# Patient Record
Sex: Female | Born: 1980 | Race: Black or African American | Hispanic: No | Marital: Married | Smoking: Never smoker
Health system: Southern US, Community
[De-identification: ages and names within clinical notes are randomized; demographics above are authoritative.]

## PROBLEM LIST (undated history)

## (undated) ENCOUNTER — Inpatient Hospital Stay (HOSPITAL_COMMUNITY): Payer: Self-pay

## (undated) DIAGNOSIS — O009 Unspecified ectopic pregnancy without intrauterine pregnancy: Secondary | ICD-10-CM

## (undated) HISTORY — PX: NO PAST SURGERIES: SHX2092

---

## 2003-11-21 ENCOUNTER — Other Ambulatory Visit: Admission: RE | Admit: 2003-11-21 | Discharge: 2003-11-21 | Payer: Self-pay | Admitting: Obstetrics and Gynecology

## 2004-07-07 ENCOUNTER — Emergency Department (HOSPITAL_COMMUNITY): Admission: EM | Admit: 2004-07-07 | Discharge: 2004-07-07 | Payer: Self-pay | Admitting: Emergency Medicine

## 2005-02-20 ENCOUNTER — Inpatient Hospital Stay (HOSPITAL_COMMUNITY): Admission: AD | Admit: 2005-02-20 | Discharge: 2005-02-20 | Payer: Self-pay | Admitting: Obstetrics and Gynecology

## 2005-09-05 ENCOUNTER — Ambulatory Visit (HOSPITAL_COMMUNITY): Admission: RE | Admit: 2005-09-05 | Discharge: 2005-09-05 | Payer: Self-pay | Admitting: Specialist

## 2005-12-09 ENCOUNTER — Other Ambulatory Visit: Admission: RE | Admit: 2005-12-09 | Discharge: 2005-12-09 | Payer: Self-pay | Admitting: Obstetrics and Gynecology

## 2005-12-19 ENCOUNTER — Emergency Department (HOSPITAL_COMMUNITY): Admission: EM | Admit: 2005-12-19 | Discharge: 2005-12-19 | Payer: Self-pay | Admitting: Emergency Medicine

## 2007-03-01 ENCOUNTER — Other Ambulatory Visit: Admission: RE | Admit: 2007-03-01 | Discharge: 2007-03-01 | Payer: Self-pay | Admitting: Obstetrics and Gynecology

## 2008-03-06 ENCOUNTER — Other Ambulatory Visit: Admission: RE | Admit: 2008-03-06 | Discharge: 2008-03-06 | Payer: Self-pay | Admitting: Obstetrics and Gynecology

## 2009-02-15 ENCOUNTER — Ambulatory Visit (HOSPITAL_COMMUNITY): Admission: RE | Admit: 2009-02-15 | Discharge: 2009-02-15 | Payer: Self-pay | Admitting: Obstetrics and Gynecology

## 2009-03-02 ENCOUNTER — Inpatient Hospital Stay (HOSPITAL_COMMUNITY): Admission: AD | Admit: 2009-03-02 | Discharge: 2009-03-02 | Payer: Self-pay | Admitting: Obstetrics and Gynecology

## 2009-12-28 ENCOUNTER — Other Ambulatory Visit: Admission: RE | Admit: 2009-12-28 | Discharge: 2009-12-28 | Payer: Self-pay | Admitting: Obstetrics and Gynecology

## 2010-08-08 LAB — CBC
HCT: 37 % (ref 36.0–46.0)
Hemoglobin: 12.4 g/dL (ref 12.0–15.0)
MCHC: 33.4 g/dL (ref 30.0–36.0)
MCV: 94.8 fL (ref 78.0–100.0)
Platelets: 239 10*3/uL (ref 150–400)
RBC: 3.91 MIL/uL (ref 3.87–5.11)
RDW: 13.1 % (ref 11.5–15.5)
WBC: 9.1 10*3/uL (ref 4.0–10.5)

## 2010-08-08 LAB — CREATININE, SERUM
Creatinine, Ser: 0.65 mg/dL (ref 0.4–1.2)
GFR calc Af Amer: >60 mL/min (ref >60)
GFR calc non Af Amer: >60 mL/min (ref >60)

## 2010-08-08 LAB — DIFFERENTIAL
Basophils Absolute: 0 10*3/uL (ref 0.0–0.1)
Basophils Relative: 0 % (ref 0–1)
Eosinophils Absolute: 0.1 10*3/uL (ref 0.0–0.7)
Eosinophils Relative: 1 % (ref 0–5)
Lymphocytes Relative: 35 % (ref 12–46)
Lymphs Abs: 3.2 10*3/uL (ref 0.7–4.0)
Monocytes Absolute: 0.6 10*3/uL (ref 0.1–1.0)
Monocytes Relative: 7 % (ref 3–12)
Neutro Abs: 5.2 10*3/uL (ref 1.7–7.7)
Neutrophils Relative %: 57 % (ref 43–77)

## 2010-08-08 LAB — ABO/RH: ABO/RH(D): O POS

## 2010-08-08 LAB — BUN: BUN: 5 mg/dL — ABNORMAL LOW (ref 6–23)

## 2010-08-08 LAB — AST: AST: 20 U/L (ref 0–37)

## 2011-01-27 ENCOUNTER — Other Ambulatory Visit (HOSPITAL_COMMUNITY)
Admission: RE | Admit: 2011-01-27 | Discharge: 2011-01-27 | Disposition: A | Discharge status: Home / Self Care | Payer: Self-pay | Source: Ambulatory Visit | Attending: Obstetrics and Gynecology | Admitting: Obstetrics and Gynecology

## 2011-01-27 ENCOUNTER — Other Ambulatory Visit: Payer: Self-pay | Admitting: Obstetrics and Gynecology

## 2011-01-27 DIAGNOSIS — Z01419 Encounter for gynecological examination (general) (routine) without abnormal findings: Secondary | ICD-10-CM | POA: Insufficient documentation

## 2012-03-01 ENCOUNTER — Other Ambulatory Visit (HOSPITAL_COMMUNITY)
Admission: RE | Admit: 2012-03-01 | Discharge: 2012-03-01 | Disposition: A | Discharge status: Home / Self Care | Payer: Managed Care, Other (non HMO) | Source: Ambulatory Visit | Attending: Obstetrics and Gynecology | Admitting: Obstetrics and Gynecology

## 2012-03-01 ENCOUNTER — Other Ambulatory Visit: Payer: Self-pay | Admitting: Obstetrics and Gynecology

## 2012-03-01 DIAGNOSIS — Z01419 Encounter for gynecological examination (general) (routine) without abnormal findings: Secondary | ICD-10-CM | POA: Insufficient documentation

## 2013-02-28 ENCOUNTER — Other Ambulatory Visit (HOSPITAL_COMMUNITY): Payer: Self-pay | Admitting: Obstetrics and Gynecology

## 2013-02-28 DIAGNOSIS — IMO0002 Reserved for concepts with insufficient information to code with codable children: Secondary | ICD-10-CM

## 2013-03-02 ENCOUNTER — Ambulatory Visit (HOSPITAL_COMMUNITY)
Admission: RE | Admit: 2013-03-02 | Discharge: 2013-03-02 | Disposition: A | Discharge status: Home / Self Care | Payer: Managed Care, Other (non HMO) | Source: Ambulatory Visit | Attending: Obstetrics and Gynecology | Admitting: Obstetrics and Gynecology

## 2013-03-02 DIAGNOSIS — N979 Female infertility, unspecified: Secondary | ICD-10-CM | POA: Insufficient documentation

## 2013-03-02 DIAGNOSIS — IMO0002 Reserved for concepts with insufficient information to code with codable children: Secondary | ICD-10-CM

## 2013-03-02 MED ORDER — IOHEXOL 300 MG/ML  SOLN
20.0000 mL | Freq: Once | INTRAMUSCULAR | Status: AC | PRN
  Administered 2013-03-02: 20 mL

## 2014-04-18 ENCOUNTER — Other Ambulatory Visit: Payer: Self-pay | Admitting: Obstetrics and Gynecology

## 2014-04-18 ENCOUNTER — Other Ambulatory Visit (HOSPITAL_COMMUNITY)
Admission: RE | Admit: 2014-04-18 | Discharge: 2014-04-18 | Disposition: A | Discharge status: Home / Self Care | Payer: Managed Care, Other (non HMO) | Source: Ambulatory Visit | Attending: Obstetrics and Gynecology | Admitting: Obstetrics and Gynecology

## 2014-04-18 DIAGNOSIS — Z1151 Encounter for screening for human papillomavirus (HPV): Secondary | ICD-10-CM | POA: Diagnosis present

## 2014-04-18 DIAGNOSIS — Z01419 Encounter for gynecological examination (general) (routine) without abnormal findings: Secondary | ICD-10-CM | POA: Insufficient documentation

## 2014-04-18 DIAGNOSIS — Z113 Encounter for screening for infections with a predominantly sexual mode of transmission: Secondary | ICD-10-CM | POA: Diagnosis present

## 2014-04-20 LAB — CYTOLOGY - PAP

## 2014-05-28 ENCOUNTER — Encounter (HOSPITAL_COMMUNITY): Payer: Self-pay | Admitting: *Deleted

## 2014-05-28 ENCOUNTER — Inpatient Hospital Stay (HOSPITAL_COMMUNITY)
Admission: AD | Admit: 2014-05-28 | Discharge: 2014-05-28 | Disposition: A | Discharge status: Home / Self Care | Payer: Managed Care, Other (non HMO) | Source: Ambulatory Visit | Attending: Obstetrics and Gynecology | Admitting: Obstetrics and Gynecology

## 2014-05-28 DIAGNOSIS — R109 Unspecified abdominal pain: Secondary | ICD-10-CM

## 2014-05-28 DIAGNOSIS — A084 Viral intestinal infection, unspecified: Secondary | ICD-10-CM | POA: Diagnosis not present

## 2014-05-28 DIAGNOSIS — O30002 Twin pregnancy, unspecified number of placenta and unspecified number of amniotic sacs, second trimester: Secondary | ICD-10-CM

## 2014-05-28 DIAGNOSIS — N949 Unspecified condition associated with female genital organs and menstrual cycle: Secondary | ICD-10-CM

## 2014-05-28 DIAGNOSIS — Z3A17 17 weeks gestation of pregnancy: Secondary | ICD-10-CM

## 2014-05-28 DIAGNOSIS — O30009 Twin pregnancy, unspecified number of placenta and unspecified number of amniotic sacs, unspecified trimester: Secondary | ICD-10-CM

## 2014-05-28 DIAGNOSIS — R1032 Left lower quadrant pain: Secondary | ICD-10-CM | POA: Diagnosis present

## 2014-05-28 DIAGNOSIS — O09812 Supervision of pregnancy resulting from assisted reproductive technology, second trimester: Secondary | ICD-10-CM | POA: Insufficient documentation

## 2014-05-28 DIAGNOSIS — O09522 Supervision of elderly multigravida, second trimester: Secondary | ICD-10-CM | POA: Insufficient documentation

## 2014-05-28 DIAGNOSIS — Z3A16 16 weeks gestation of pregnancy: Secondary | ICD-10-CM | POA: Diagnosis not present

## 2014-05-28 DIAGNOSIS — O9989 Other specified diseases and conditions complicating pregnancy, childbirth and the puerperium: Secondary | ICD-10-CM | POA: Insufficient documentation

## 2014-05-28 HISTORY — DX: Unspecified ectopic pregnancy without intrauterine pregnancy: O00.90

## 2014-05-28 LAB — URINALYSIS, ROUTINE W REFLEX MICROSCOPIC
Bilirubin Urine: NEGATIVE
Glucose, UA: NEGATIVE mg/dL
Hgb urine dipstick: NEGATIVE
Ketones, ur: 15 mg/dL — AB
Leukocytes, UA: NEGATIVE
Nitrite: NEGATIVE
Protein, ur: NEGATIVE mg/dL
Specific Gravity, Urine: 1.025 (ref 1.005–1.030)
Urobilinogen, UA: 0.2 mg/dL (ref 0.0–1.0)
pH: 5.5 (ref 5.0–8.0)

## 2014-05-28 MED ORDER — PROMETHAZINE HCL 12.5 MG PO TABS: 12.5000 mg | ORAL_TABLET | Freq: Four times a day (QID) | ORAL | Status: DC | PRN

## 2014-05-28 MED ORDER — ACETAMINOPHEN 500 MG PO TABS
1000.0000 mg | ORAL_TABLET | Freq: Once | ORAL | Status: AC
  Administered 2014-05-28: 1000 mg via ORAL
  Filled 2014-05-28: qty 2

## 2014-05-28 MED ORDER — PROMETHAZINE HCL 25 MG/ML IJ SOLN
25.0000 mg | Freq: Once | INTRAVENOUS | Status: AC
  Administered 2014-05-28: 25 mg via INTRAVENOUS
  Filled 2014-05-28: qty 1

## 2014-05-28 MED ORDER — ONDANSETRON HCL 4 MG PO TABS: 4.0000 mg | ORAL_TABLET | Freq: Four times a day (QID) | ORAL | Status: DC

## 2014-05-28 NOTE — MAU Provider Note (Signed)
History     CSN: [id number]  Arrival date and time: 05/28/14 1351   First Provider Initiated Contact with Patient 05/28/14 1429      Chief Complaint  Patient presents with  . Abdominal Pain  . Emesis During Pregnancy  . Diarrhea   HPI  Ms. Courtney Farmer is a 34 y.o. G3P0020 at [redacted]w[redacted]d with twins as a results of IVF who presents to MAU today with complaint of abdominal pain in the LLQ since yesterday. She states N/V/D started today. She denies vaginal bleeding, discharge or LOF. She denies UTI symptoms or fever. 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   OB History    Gravida Para Term Preterm AB TAB SAB Ectopic Multiple Living   3    2  1 1         Past Medical History  Diagnosis Date  . Ectopic pregnancy     Past Surgical History  Procedure Laterality Date  . No past surgeries      History reviewed. No pertinent family history.  History  Substance Use Topics  . Smoking status: Never Smoker   . Smokeless tobacco: Not on file  . Alcohol Use: No    Allergies: No Known Allergies  Prescriptions prior to admission  Medication Sig Dispense Refill Last Dose  . Prenatal Vit-Fe Fumarate-FA (MULTIVITAMIN-PRENATAL) 27-0.8 MG TABS tablet Take 1 tablet by mouth daily at 12 noon.   05/27/2014 at Unknown time  . pyridoxine (B-6) 100 MG tablet Take 100 mg by mouth daily as needed (nausea).   05/27/2014 at Unknown time    Review of Systems  Constitutional: Negative for fever and malaise/fatigue.  Gastrointestinal: Positive for nausea, vomiting, abdominal pain and diarrhea. Negative for constipation.  Genitourinary: Negative for dysuria, urgency and frequency.       Neg - vaginal bleeding, discharge, LOF  Neurological: Negative for weakness.   Physical Exam   Blood pressure 117/62, pulse 101, temperature 97.8 F (36.6 C), temperature source Oral, resp. rate 18.  Physical Exam  Constitutional: She is oriented to  person, place, and time. She appears well-developed and well-nourished. No distress.  HENT:  Head: Normocephalic.  Cardiovascular: Normal rate.   Respiratory: Effort normal.  GI: Soft. Bowel sounds are normal. She exhibits no distension and no mass. There is tenderness (moderate tenderness to palpation of the LLQ). There is no rebound and no guarding.  Neurological: She is alert and oriented to person, place, and time.  Skin: Skin is warm and dry. No erythema.  Psychiatric: She has a normal mood and affect.   Results for orders placed or performed during the hospital encounter of 05/28/14 (from the past 24 hour(s))  Urinalysis, Routine w reflex microscopic     Status: Abnormal   Collection Time: 05/28/14  2:02 PM  Result Value Ref Range   Color, Urine YELLOW YELLOW   APPearance CLEAR CLEAR   Specific Gravity, Urine 1.025 1.005 - 1.030   pH 5.5 5.0 - 8.0   Glucose, UA NEGATIVE NEGATIVE mg/dL   Hgb urine dipstick NEGATIVE NEGATIVE   Bilirubin Urine NEGATIVE NEGATIVE   Ketones, ur 15 (A) NEGATIVE mg/dL   Protein, ur NEGATIVE NEGATIVE mg/dL   Urobilinogen, UA 0.2 0.0 - 1.0 mg/dL   Nitrite NEGATIVE NEGATIVE   Leukocytes, UA NEGATIVE NEGATIVE     MAU Course  Procedures None  MDM FHR 136 bpm and 144 bpm with doppler UA today shows mild dehydration Discussed  with Dr. Alesia Richards at 1440 - agrees that IV fluids with anti-emetics is appropriate management. Patient may also have Tylenol for pain if desired. She also recommends follow-up with [facility] this week.  Patient given IV D5LR with 25 mg Phenergan infusion. Patient reports resolution of nausea and no episodes of vomiting or diarrhea while in MAU today. Patient continues to complain of LLQ pain. 1000 mg Tylenol given.  Patient reports some improvement in LLQ pain Assessment and Plan  A: Twin pregnancy at [redacted]w[redacted]d Viral gastroenteritis Round ligament pain  P: Discharge home Rx for Phenergan and Zofran given to patient Patient advised  to increase PO hydration as tolerated Patient advised to call the office for a follow-up appointment this week Patient may return to MAU as needed or if her condition were to change or worsen   Luvenia Redden, PA-C  05/28/2014, 4:50 PM

## 2014-05-28 NOTE — MAU Note (Signed)
Pt states she began having lower abd cramping yesterday, continues today, also began having vomiting & diarrhea today.

## 2014-05-28 NOTE — MAU Note (Signed)
IVF pregnancy

## 2014-05-28 NOTE — Discharge Instructions (Signed)
Viral Gastroenteritis °Viral gastroenteritis is also known as stomach flu. This condition affects the stomach and intestinal tract. It can cause sudden diarrhea and vomiting. The illness typically lasts 3 to 8 days. Most people develop an immune response that eventually gets rid of the virus. While this natural response develops, the virus can make you quite ill. °CAUSES  °Many different viruses can cause gastroenteritis, such as rotavirus or noroviruses. You can catch one of these viruses by consuming contaminated food or water. You may also catch a virus by sharing utensils or other personal items with an infected person or by touching a contaminated surface. °SYMPTOMS  °The most common symptoms are diarrhea and vomiting. These problems can cause a severe loss of body fluids (dehydration) and a body salt (electrolyte) imbalance. Other symptoms may include: °· Fever. °· Headache. °· Fatigue. °· Abdominal pain. °DIAGNOSIS  °Your caregiver can usually diagnose viral gastroenteritis based on your symptoms and a physical exam. A stool sample may also be taken to test for the presence of viruses or other infections. °TREATMENT  °This illness typically goes away on its own. Treatments are aimed at rehydration. The most serious cases of viral gastroenteritis involve vomiting so severely that you are not able to keep fluids down. In these cases, fluids must be given through an intravenous line (IV). °HOME CARE INSTRUCTIONS  °· Drink enough fluids to keep your urine clear or pale yellow. Drink small amounts of fluids frequently and increase the amounts as tolerated. °· Ask your caregiver for specific rehydration instructions. °· Avoid: °¨ Foods high in sugar. °¨ Alcohol. °¨ Carbonated drinks. °¨ Tobacco. °¨ Juice. °¨ Caffeine drinks. °¨ Extremely hot or cold fluids. °¨ Fatty, greasy foods. °¨ Too much intake of anything at one time. °¨ Dairy products until 24 to 48 hours after diarrhea stops. °· You may consume probiotics.  Probiotics are active cultures of beneficial bacteria. They may lessen the amount and number of diarrheal stools in adults. Probiotics can be found in yogurt with active cultures and in supplements. °· Wash your hands well to avoid spreading the virus. °· Only take over-the-counter or prescription medicines for pain, discomfort, or fever as directed by your caregiver. Do not give aspirin to children. Antidiarrheal medicines are not recommended. °· Ask your caregiver if you should continue to take your regular prescribed and over-the-counter medicines. °· Keep all follow-up appointments as directed by your caregiver. °SEEK IMMEDIATE MEDICAL CARE IF:  °· You are unable to keep fluids down. °· You do not urinate at least once every 6 to 8 hours. °· You develop shortness of breath. °· You notice blood in your stool or vomit. This may look like coffee grounds. °· You have abdominal pain that increases or is concentrated in one small area (localized). °· You have persistent vomiting or diarrhea. °· You have a fever. °· The patient is a child younger than 3 months, and he or she has a fever. °· The patient is a child older than 3 months, and he or she has a fever and persistent symptoms. °· The patient is a child older than 3 months, and he or she has a fever and symptoms suddenly get worse. °· The patient is a baby, and he or she has no tears when crying. °MAKE SURE YOU:  °· Understand these instructions. °· Will watch your condition. °· Will get help right away if you are not doing well or get worse. °Document Released: 04/21/2005 Document Revised: 07/14/2011 Document Reviewed: 02/05/2011 °  [employer] Patient Information 2015 [place]. This information is not intended to replace advice given to you by your health care provider. Make sure you discuss any questions you have with your health care provider. Abdominal Pain During Pregnancy Belly (abdominal) pain is common during pregnancy. Most of the time, it is not a  serious problem. Other times, it can be a sign that something is wrong with the pregnancy. Always tell your doctor if you have belly pain. HOME CARE Monitor your belly pain for any changes. The following actions may help you feel better:  Do not have sex (intercourse) or put anything in your vagina until you feel better.  Rest until your pain stops.  Drink clear fluids if you feel sick to your stomach (nauseous). Do not eat solid food until you feel better.  Only take medicine as told by your doctor.  Keep all doctor visits as told. GET HELP RIGHT AWAY IF:   You are bleeding, leaking fluid, or pieces of tissue come out of your vagina.  You have more pain or cramping.  You keep throwing up (vomiting).  You have pain when you pee (urinate) or have blood in your pee.  You have a fever.  You do not feel your baby moving as much.  You feel very weak or feel like passing out.  You have trouble breathing, with or without belly pain.  You have a very bad headache and belly pain.  You have fluid leaking from your vagina and belly pain.  You keep having watery poop (diarrhea).  Your belly pain does not go away after resting, or the pain gets worse. MAKE SURE YOU:   Understand these instructions.  Will watch your condition.  Will get help right away if you are not doing well or get worse. Document Released: 04/09/2009 Document Revised: 12/22/2012 Document Reviewed: 11/18/2012 [facility] Patient Information 2015 [place], [place]. This information is not intended to replace advice given to you by your health care provider. Make sure you discuss any questions you have with your health care provider.

## 2014-06-28 LAB — US OB DETAIL + 14 WK

## 2014-06-29 ENCOUNTER — Other Ambulatory Visit (HOSPITAL_COMMUNITY): Payer: Self-pay | Admitting: Obstetrics and Gynecology

## 2014-06-29 DIAGNOSIS — O4102X2 Oligohydramnios, second trimester, fetus 2: Secondary | ICD-10-CM

## 2014-06-29 DIAGNOSIS — IMO0001 Reserved for inherently not codable concepts without codable children: Secondary | ICD-10-CM

## 2014-06-29 DIAGNOSIS — Z3689 Encounter for other specified antenatal screening: Secondary | ICD-10-CM

## 2014-07-07 ENCOUNTER — Encounter (HOSPITAL_COMMUNITY): Payer: Self-pay

## 2014-07-07 ENCOUNTER — Other Ambulatory Visit (HOSPITAL_COMMUNITY): Payer: Self-pay | Admitting: Obstetrics and Gynecology

## 2014-07-07 ENCOUNTER — Ambulatory Visit (HOSPITAL_COMMUNITY)
Admission: RE | Admit: 2014-07-07 | Discharge: 2014-07-07 | Disposition: A | Discharge status: Home / Self Care | Payer: Managed Care, Other (non HMO) | Source: Ambulatory Visit | Attending: Obstetrics and Gynecology | Admitting: Obstetrics and Gynecology

## 2014-07-07 DIAGNOSIS — Z36 Encounter for antenatal screening of mother: Secondary | ICD-10-CM | POA: Insufficient documentation

## 2014-07-07 DIAGNOSIS — IMO0001 Reserved for inherently not codable concepts without codable children: Secondary | ICD-10-CM

## 2014-07-07 DIAGNOSIS — O09522 Supervision of elderly multigravida, second trimester: Secondary | ICD-10-CM | POA: Insufficient documentation

## 2014-07-07 DIAGNOSIS — Z3A22 22 weeks gestation of pregnancy: Secondary | ICD-10-CM | POA: Diagnosis not present

## 2014-07-07 DIAGNOSIS — O30042 Twin pregnancy, dichorionic/diamniotic, second trimester: Secondary | ICD-10-CM | POA: Diagnosis not present

## 2014-07-07 DIAGNOSIS — O09812 Supervision of pregnancy resulting from assisted reproductive technology, second trimester: Secondary | ICD-10-CM | POA: Diagnosis not present

## 2014-07-07 DIAGNOSIS — O4102X2 Oligohydramnios, second trimester, fetus 2: Secondary | ICD-10-CM

## 2014-07-07 DIAGNOSIS — Z3689 Encounter for other specified antenatal screening: Secondary | ICD-10-CM | POA: Insufficient documentation

## 2014-07-10 ENCOUNTER — Encounter (HOSPITAL_COMMUNITY): Payer: Self-pay | Admitting: Obstetrics and Gynecology

## 2014-07-11 ENCOUNTER — Other Ambulatory Visit (HOSPITAL_COMMUNITY): Payer: Self-pay | Admitting: Obstetrics and Gynecology

## 2014-07-31 ENCOUNTER — Other Ambulatory Visit (HOSPITAL_COMMUNITY): Payer: Self-pay | Admitting: Maternal and Fetal Medicine

## 2014-07-31 DIAGNOSIS — O09812 Supervision of pregnancy resulting from assisted reproductive technology, second trimester: Secondary | ICD-10-CM

## 2014-07-31 DIAGNOSIS — O09522 Supervision of elderly multigravida, second trimester: Secondary | ICD-10-CM

## 2014-07-31 DIAGNOSIS — O30042 Twin pregnancy, dichorionic/diamniotic, second trimester: Secondary | ICD-10-CM

## 2014-07-31 DIAGNOSIS — Z3A27 27 weeks gestation of pregnancy: Secondary | ICD-10-CM

## 2014-08-09 ENCOUNTER — Encounter (HOSPITAL_COMMUNITY): Payer: Self-pay

## 2014-08-09 ENCOUNTER — Encounter (HOSPITAL_COMMUNITY): Payer: Self-pay | Admitting: Anesthesiology

## 2014-08-09 ENCOUNTER — Inpatient Hospital Stay (HOSPITAL_COMMUNITY)
Admission: AD | Admit: 2014-08-09 | Discharge: 2014-09-06 | DRG: 765 | Disposition: A | Discharge status: Home / Self Care | Payer: Managed Care, Other (non HMO) | Source: Ambulatory Visit | Attending: Obstetrics and Gynecology | Admitting: Obstetrics and Gynecology

## 2014-08-09 ENCOUNTER — Inpatient Hospital Stay (HOSPITAL_COMMUNITY): Payer: Managed Care, Other (non HMO)

## 2014-08-09 ENCOUNTER — Other Ambulatory Visit (HOSPITAL_COMMUNITY): Payer: Self-pay | Admitting: Maternal and Fetal Medicine

## 2014-08-09 ENCOUNTER — Ambulatory Visit (HOSPITAL_COMMUNITY)
Admission: RE | Admit: 2014-08-09 | Discharge: 2014-08-09 | Disposition: A | Discharge status: Home / Self Care | Payer: Managed Care, Other (non HMO) | Source: Ambulatory Visit | Attending: Obstetrics and Gynecology | Admitting: Obstetrics and Gynecology

## 2014-08-09 DIAGNOSIS — D252 Subserosal leiomyoma of uterus: Secondary | ICD-10-CM | POA: Diagnosis present

## 2014-08-09 DIAGNOSIS — O4102X1 Oligohydramnios, second trimester, fetus 1: Secondary | ICD-10-CM | POA: Diagnosis present

## 2014-08-09 DIAGNOSIS — Z3A27 27 weeks gestation of pregnancy: Secondary | ICD-10-CM

## 2014-08-09 DIAGNOSIS — O42912 Preterm premature rupture of membranes, unspecified as to length of time between rupture and onset of labor, second trimester: Secondary | ICD-10-CM | POA: Diagnosis present

## 2014-08-09 DIAGNOSIS — Z3A30 30 weeks gestation of pregnancy: Secondary | ICD-10-CM | POA: Diagnosis not present

## 2014-08-09 DIAGNOSIS — Z3A22 22 weeks gestation of pregnancy: Secondary | ICD-10-CM

## 2014-08-09 DIAGNOSIS — O4103X2 Oligohydramnios, third trimester, fetus 2: Secondary | ICD-10-CM

## 2014-08-09 DIAGNOSIS — O09522 Supervision of elderly multigravida, second trimester: Secondary | ICD-10-CM

## 2014-08-09 DIAGNOSIS — O30042 Twin pregnancy, dichorionic/diamniotic, second trimester: Secondary | ICD-10-CM | POA: Diagnosis present

## 2014-08-09 DIAGNOSIS — O09812 Supervision of pregnancy resulting from assisted reproductive technology, second trimester: Secondary | ICD-10-CM

## 2014-08-09 DIAGNOSIS — O4103X1 Oligohydramnios, third trimester, fetus 1: Secondary | ICD-10-CM | POA: Diagnosis not present

## 2014-08-09 DIAGNOSIS — O4100X0 Oligohydramnios, unspecified trimester, not applicable or unspecified: Secondary | ICD-10-CM | POA: Insufficient documentation

## 2014-08-09 DIAGNOSIS — Z23 Encounter for immunization: Secondary | ICD-10-CM | POA: Diagnosis not present

## 2014-08-09 DIAGNOSIS — O42919 Preterm premature rupture of membranes, unspecified as to length of time between rupture and onset of labor, unspecified trimester: Secondary | ICD-10-CM | POA: Diagnosis not present

## 2014-08-09 DIAGNOSIS — Z3689 Encounter for other specified antenatal screening: Secondary | ICD-10-CM

## 2014-08-09 DIAGNOSIS — O4103X0 Oligohydramnios, third trimester, not applicable or unspecified: Secondary | ICD-10-CM | POA: Diagnosis present

## 2014-08-09 DIAGNOSIS — O30043 Twin pregnancy, dichorionic/diamniotic, third trimester: Secondary | ICD-10-CM | POA: Diagnosis not present

## 2014-08-09 DIAGNOSIS — O4290 Premature rupture of membranes, unspecified as to length of time between rupture and onset of labor, unspecified weeks of gestation: Secondary | ICD-10-CM

## 2014-08-09 DIAGNOSIS — O4702 False labor before 37 completed weeks of gestation, second trimester: Secondary | ICD-10-CM | POA: Diagnosis present

## 2014-08-09 DIAGNOSIS — O3412 Maternal care for benign tumor of corpus uteri, second trimester: Secondary | ICD-10-CM | POA: Diagnosis present

## 2014-08-09 DIAGNOSIS — O09529 Supervision of elderly multigravida, unspecified trimester: Secondary | ICD-10-CM | POA: Insufficient documentation

## 2014-08-09 DIAGNOSIS — O323XX1 Maternal care for face, brow and chin presentation, fetus 1: Secondary | ICD-10-CM | POA: Diagnosis present

## 2014-08-09 DIAGNOSIS — O328XX2 Maternal care for other malpresentation of fetus, fetus 2: Secondary | ICD-10-CM | POA: Diagnosis present

## 2014-08-09 DIAGNOSIS — O09523 Supervision of elderly multigravida, third trimester: Secondary | ICD-10-CM

## 2014-08-09 DIAGNOSIS — D259 Leiomyoma of uterus, unspecified: Secondary | ICD-10-CM | POA: Diagnosis not present

## 2014-08-09 DIAGNOSIS — O30002 Twin pregnancy, unspecified number of placenta and unspecified number of amniotic sacs, second trimester: Secondary | ICD-10-CM

## 2014-08-09 DIAGNOSIS — O09819 Supervision of pregnancy resulting from assisted reproductive technology, unspecified trimester: Secondary | ICD-10-CM | POA: Insufficient documentation

## 2014-08-09 DIAGNOSIS — O09813 Supervision of pregnancy resulting from assisted reproductive technology, third trimester: Secondary | ICD-10-CM

## 2014-08-09 DIAGNOSIS — O3413 Maternal care for benign tumor of corpus uteri, third trimester: Secondary | ICD-10-CM | POA: Diagnosis not present

## 2014-08-09 LAB — TYPE AND SCREEN
ABO/RH(D): O POS
Antibody Screen: NEGATIVE

## 2014-08-09 LAB — CBC WITH DIFFERENTIAL/PLATELET
BASOS PCT: 0 % (ref 0–1)
Basophils Absolute: 0 10*3/uL (ref 0.0–0.1)
EOS ABS: 0.1 10*3/uL (ref 0.0–0.7)
EOS PCT: 1 % (ref 0–5)
HEMATOCRIT: 29.3 % — AB (ref 36.0–46.0)
Hemoglobin: 9.8 g/dL — ABNORMAL LOW (ref 12.0–15.0)
Lymphocytes Relative: 16 % (ref 12–46)
Lymphs Abs: 1.5 10*3/uL (ref 0.7–4.0)
MCH: 29.3 pg (ref 26.0–34.0)
MCHC: 33.4 g/dL (ref 30.0–36.0)
MCV: 87.5 fL (ref 78.0–100.0)
MONOS PCT: 9 % (ref 3–12)
Monocytes Absolute: 0.8 10*3/uL (ref 0.1–1.0)
Neutro Abs: 6.6 10*3/uL (ref 1.7–7.7)
Neutrophils Relative %: 74 % (ref 43–77)
Platelets: 236 10*3/uL (ref 150–400)
RBC: 3.35 MIL/uL — ABNORMAL LOW (ref 3.87–5.11)
RDW: 16.6 % — ABNORMAL HIGH (ref 11.5–15.5)
WBC: 9 10*3/uL (ref 4.0–10.5)

## 2014-08-09 LAB — GROUP B STREP BY PCR: Group B strep by PCR: NEGATIVE

## 2014-08-09 LAB — POCT FERN TEST: POCT FERN TEST: POSITIVE

## 2014-08-09 MED ORDER — AZITHROMYCIN 1 G PO PACK
1.0000 g | PACK | Freq: Once | ORAL | Status: AC
  Administered 2014-08-09: 1 g via ORAL
  Filled 2014-08-09 (×2): qty 1

## 2014-08-09 MED ORDER — LACTATED RINGERS IV SOLN
INTRAVENOUS | Status: DC
  Administered 2014-08-09 – 2014-09-03 (×6): via INTRAVENOUS

## 2014-08-09 MED ORDER — MAGNESIUM SULFATE 40 G IN LACTATED RINGERS - SIMPLE
2.0000 g/h | INTRAVENOUS | Status: AC
  Administered 2014-08-10: 2 g/h via INTRAVENOUS
  Filled 2014-08-09 (×2): qty 500

## 2014-08-09 MED ORDER — SODIUM CHLORIDE 0.9 % IV SOLN
2.0000 g | Freq: Four times a day (QID) | INTRAVENOUS | Status: AC
  Administered 2014-08-09 – 2014-08-11 (×8): 2 g via INTRAVENOUS
  Filled 2014-08-09 (×8): qty 2000

## 2014-08-09 MED ORDER — SODIUM CHLORIDE 0.9 % IV SOLN
3.0000 g | Freq: Four times a day (QID) | INTRAVENOUS | Status: DC
  Filled 2014-08-09 (×2): qty 3

## 2014-08-09 MED ORDER — BETAMETHASONE SOD PHOS & ACET 6 (3-3) MG/ML IJ SUSP
12.0000 mg | Freq: Once | INTRAMUSCULAR | Status: AC
  Administered 2014-08-10: 12 mg via INTRAMUSCULAR
  Filled 2014-08-09: qty 2

## 2014-08-09 MED ORDER — DOCUSATE SODIUM 100 MG PO CAPS
100.0000 mg | ORAL_CAPSULE | Freq: Every day | ORAL | Status: DC
  Administered 2014-08-10 – 2014-09-02 (×24): 100 mg via ORAL
  Filled 2014-08-09 (×25): qty 1

## 2014-08-09 MED ORDER — ZOLPIDEM TARTRATE 5 MG PO TABS: 5.0000 mg | ORAL_TABLET | Freq: Every evening | ORAL | Status: DC | PRN

## 2014-08-09 MED ORDER — CALCIUM CARBONATE ANTACID 500 MG PO CHEW
2.0000 | CHEWABLE_TABLET | ORAL | Status: DC | PRN
  Administered 2014-08-10: 400 mg via ORAL
  Filled 2014-08-09: qty 2
  Filled 2014-08-09: qty 1

## 2014-08-09 MED ORDER — AZITHROMYCIN 250 MG PO TABS
1000.0000 mg | ORAL_TABLET | Freq: Once | ORAL | Status: AC
  Administered 2014-08-09: 1000 mg via ORAL
  Filled 2014-08-09: qty 4

## 2014-08-09 MED ORDER — BETAMETHASONE SOD PHOS & ACET 6 (3-3) MG/ML IJ SUSP
12.0000 mg | Freq: Once | INTRAMUSCULAR | Status: AC
  Administered 2014-08-09: 12 mg via INTRAMUSCULAR
  Filled 2014-08-09: qty 2

## 2014-08-09 MED ORDER — ACETAMINOPHEN 325 MG PO TABS
650.0000 mg | ORAL_TABLET | ORAL | Status: DC | PRN
  Administered 2014-08-15: 650 mg via ORAL
  Filled 2014-08-09: qty 2

## 2014-08-09 MED ORDER — PRENATAL MULTIVITAMIN CH
1.0000 | ORAL_TABLET | Freq: Every day | ORAL | Status: DC
  Administered 2014-08-09 – 2014-09-02 (×25): 1 via ORAL
  Filled 2014-08-09 (×27): qty 1

## 2014-08-09 MED ORDER — MAGNESIUM SULFATE BOLUS VIA INFUSION
4.0000 g | Freq: Once | INTRAVENOUS | Status: AC
  Administered 2014-08-09: 4 g via INTRAVENOUS
  Filled 2014-08-09: qty 500

## 2014-08-09 NOTE — Progress Notes (Signed)
Ur chart review completed.  

## 2014-08-09 NOTE — MAU Note (Signed)
Urine in lab 

## 2014-08-09 NOTE — MAU Provider Note (Signed)
History     CSN: [id number]  Arrival date and time: 08/09/14 7829   First Provider Initiated Contact with Patient 08/09/14 1041      Chief Complaint  Patient presents with  . Rupture of Membranes   Gynecologic Exam The patient's primary symptoms include vaginal discharge (watery). The patient's pertinent negatives include no genital itching, genital lesions or vaginal bleeding. This is a new problem. The current episode started 1 to 4 weeks ago. The problem occurs constantly. The problem has been gradually worsening. She is pregnant. Pertinent negatives include no abdominal pain, chills, constipation, diarrhea, dysuria, fever, headaches, nausea or vomiting. The vaginal discharge was clear. There has been no bleeding.   This is a 34 y.o. female at [redacted]w[redacted]d with twin gestation who presents from MFM for evaluation of PPROM.  She reports leaking 3 weeks ago which increased yesterday. Has had some spotting. Reports no contractions and good FM.   RN Note: Pt presents from MFM for r/o SROM. States she first noticed leaking 3 weeks ago but dr. Michela Pitcher everything was ok. Leaking got worse last week. Reports some spotting due to a yeast infection she is being treated for. Reports good fetal movement. Twin pregnancy          OB History    Gravida Para Term Preterm AB TAB SAB Ectopic Multiple Living   3    2  1 1         Past Medical History  Diagnosis Date  . Ectopic pregnancy     Past Surgical History  Procedure Laterality Date  . No past surgeries      History reviewed. No pertinent family history.  History  Substance Use Topics  . Smoking status: Never Smoker   . Smokeless tobacco: Not on file  . Alcohol Use: No    Allergies: No Known Allergies  Prescriptions prior to admission  Medication Sig Dispense Refill Last Dose  . ondansetron (ZOFRAN) 4 MG tablet Take 1 tablet (4 mg total) by mouth every 6 (six) hours. (Patient not taking: Reported on 07/07/2014) 12 tablet 0 Not Taking   . Prenatal Vit-Fe Fumarate-FA (MULTIVITAMIN-PRENATAL) 27-0.8 MG TABS tablet Take 1 tablet by mouth daily at 12 noon.   Taking  . promethazine (PHENERGAN) 12.5 MG tablet Take 1 tablet (12.5 mg total) by mouth every 6 (six) hours as needed for nausea or vomiting. (Patient not taking: Reported on 07/07/2014) 30 tablet 0 Not Taking  . pyridoxine (B-6) 100 MG tablet Take 100 mg by mouth daily as needed (nausea).   Taking    Review of Systems  Constitutional: Negative for fever, chills and malaise/fatigue.  Gastrointestinal: Negative for nausea, vomiting, abdominal pain, diarrhea and constipation.  Genitourinary: Positive for vaginal discharge (watery). Negative for dysuria.  Neurological: Negative for dizziness and headaches.  Psychiatric/Behavioral: Negative for depression.   Physical Exam   Blood pressure 116/80, pulse 99, temperature 98.6 F (37 C), temperature source Oral, resp. rate 18, last menstrual period 01/28/2014.  Physical Exam  Constitutional: She is oriented to person, place, and time. She appears well-developed and well-nourished. No distress.  HENT:  Head: Normocephalic.  Cardiovascular: Normal rate and regular rhythm.   Respiratory: Effort normal. No respiratory distress.  GI: Soft. She exhibits no distension. There is no tenderness. There is no rebound and no guarding.  Genitourinary: Vaginal discharge found.  Gross pooling of clear fluid + ferning Cervix visually closed and long  Musculoskeletal: Normal range of motion.  Neurological: She is alert and oriented to  person, place, and time.  Skin: Skin is warm and dry.  Psychiatric: She has a normal mood and affect.   Difficult to get FHR on Twin A Bedside US done but Twin A appears to be transverse. Difficult to ascertain FHR, at one point appeared to be slowed at low 100s.   MAU Course  Procedures  MDM Consulted Dr Landry Mellow. Admit to Antenatal for obs, antibiotics, Magnesium Sulfate, NICU consult and bedrest. Also  betamethasone  Assessment and Plan  A;  Twin IUP at [redacted]w[redacted]d        PPROM       P:  Admit to Antenatal        Unasyn       Betamethasone       Magnesium Sulfate for neuroprohylaxis       [place] for fetal position and verification of FHR twin A         [facility] 08/09/2014, 10:52 AM

## 2014-08-09 NOTE — MAU Note (Signed)
Pt presents from MFM for r/o SROM. States she first noticed leaking 3 weeks ago but dr. Michela Pitcher everything was ok. Leaking got worse last week. Reports some spotting due to a yeast infection she is being treated for. Reports good fetal movement. Twin pregnancy

## 2014-08-09 NOTE — H&P (Signed)
Courtney Farmer is a 34 y.o. female G3 P0020 at 27 wks and 2 days based on embryo trasfer date and 11 wk ultrasound with EDD 7.4.2016. Pregnancy care at [facility] with Dr. Landry Mellow...   Pregnancy complicated by Di/Di twins and oligohydramnios of Twin A. Pt was seen in the maternal fetal care center this morning by Dr. Venetia Maxon and sent to MAU to rule out rupture. She was noted to have gross pooling and Fern + cervix closed visually  per CNM in MAU. Pt reports a watery discharge that she noticed 3 wks ago. Worse the last couple of days.  She denies contractions or vaginal bleeding. + FM of both twins.      History OB History    Gravida Para Term Preterm AB TAB SAB Ectopic Multiple Living   3    2  1 1        Past Medical History  Diagnosis Date  . Ectopic pregnancy    Past Surgical History  Procedure Laterality Date  . No past surgeries     Family History: family history is not on file. Social History:  reports that she has never smoked. She does not have any smokeless tobacco history on file. She reports that she does not drink alcohol or use illicit drugs.   Prenatal Transfer Tool  Maternal Diabetes: No Genetic Screening: Normal Maternal Ultrasounds/Referrals: Abnormal:  Findings:   Other: oligo fo twin A Fetal Ultrasounds or other Referrals:  Referred to Materal Fetal Medicine  Maternal Substance Abuse:  No Significant Maternal Medications:  None Significant Maternal Lab Results:  Lab values include: Group B Strep negative Other Comments:  None  Review of Systems  Constitutional: Negative.   HENT: Negative.   Eyes: Negative.   Respiratory: Negative.   Cardiovascular: Negative.   Gastrointestinal: Negative.   Genitourinary: Negative.   Musculoskeletal: Negative.   Skin: Negative.   Neurological: Negative.   Endo/Heme/Allergies: Negative.   Psychiatric/Behavioral: Negative.   All other systems reviewed and are negative.     Blood pressure 115/60, pulse 129, temperature  98.2 F (36.8 C), temperature source Oral, resp. rate 20, height 5\' 7"  (1.702 m), weight 104.327 kg (230 lb), last menstrual period 01/28/2014, SpO2 99 %. Exam Physical Exam  Nursing note and vitals reviewed. Constitutional: She is oriented to person, place, and time. She appears well-developed and well-nourished.  HENT:  Head: Normocephalic and atraumatic.  Neck: Normal range of motion. Neck supple.  Cardiovascular: Normal rate and regular rhythm.   Respiratory: Effort normal and breath sounds normal.  GI: Soft. There is no tenderness.  Genitourinary: Vagina normal and uterus normal.  Musculoskeletal: Normal range of motion.  Neurological: She is alert and oriented to person, place, and time. She has normal reflexes.  Skin: Skin is warm and dry.  Psychiatric: She has a normal mood and affect.    Per CNM in MAU.Marland Kitchen Cervix closed visually  FHR twin A 130's moderate variability + accelerations reassuring  Twin B 140's moderate variability + accelerations reassuring  Toco No contractions   Prenatal labs: ABO, Rh: --/--/O POS (04/06 1320) Antibody: NEG (04/06 1320) Rubella:  None immune   RPR:   Nonreactive  HBsAg:  Negative   HIV:   Non reactive  GBS:   Negative   Ultrasound in [place] 08/09/2014 Fetus A transverse with head on maternal Right.. Oligohydramnios. 1.5 cm pocket.. EFW 1001 grams  Fetus B not mentioned in report from [facility] today..   Assessment/Plan: 27 wks and 2 days with  Courtney Farmer twin pregnancy via IVF with PPROM of Twin A Antibiotics to prolong latency.. Plan ampicillin for 48 hours and then switch to amoxicillin for 5 days  Betamethasone for FLM Magnesium sulfate for neuroprophylaxis  NICU consult   Plan to check u/s for Fetus B efw and AFI tomorrow  Courtney Heatherly J. 08/09/2014, 5:47 PM

## 2014-08-09 NOTE — MAU Note (Signed)
Report called to Tammy RN on Antenatal. Will go to 152

## 2014-08-09 NOTE — MAU Note (Signed)
Courtney Farmer CNM at bedside with u/s to confirm baby A FHR

## 2014-08-09 NOTE — Plan of Care (Signed)
Problem: Consults Goal: Birthing Suites Patient Information Press F2 to bring up selections list   Pt < [redacted] weeks EGA     

## 2014-08-10 MED ORDER — AMOXICILLIN 500 MG PO CAPS
500.0000 mg | ORAL_CAPSULE | Freq: Three times a day (TID) | ORAL | Status: AC
  Administered 2014-08-11 – 2014-08-16 (×15): 500 mg via ORAL
  Filled 2014-08-10 (×15): qty 1

## 2014-08-10 MED ORDER — FAMOTIDINE 20 MG PO TABS
20.0000 mg | ORAL_TABLET | Freq: Every day | ORAL | Status: DC
  Administered 2014-08-10 – 2014-09-02 (×24): 20 mg via ORAL
  Filled 2014-08-10 (×25): qty 1

## 2014-08-10 NOTE — Consult Note (Signed)
Neonatology Consult Note:  At the request of the patients obstetrician Dr. Cole I met with Lasaundra Pehrson and her husband.  She is curently 27 3 weeks based on embryo trasfer date and 11 wk ultrasound with pregnancy complicated by Di/Di twins via IVF and oligohydramnios / PPROM of Twin A.   She is on latency antibiotics, betamethasone started 4/7 and is on magnesium sulfate for neuroprophylaxis.   We discussed morbidity/mortality at this gestional age, delivery room resuscitation, including intubation and surfactant in DR.  Discussed mechanical ventilation and risk for chronic lung disease, risk for IVH with potential for motor / cognitive deficits, ROP, NEC, sepsis, as well as temperature instability and feeding immaturity.  Discussed NG / OG feeds, benefits of MBM in reducing incidence of NEC.   Discussed likely length of stay.  Thank you for allowing us to participate in her care.  Please call with questions.  Benjamin Rattray, DO  Neonatologist  The total length of face-to-face or floor / unit time for this encounter was 25 minutes.  Counseling and / or coordination of care was greater than fifty percent of the time.        

## 2014-08-10 NOTE — Progress Notes (Signed)
This note also relates to the following rows which could not be included: Pulse Rate - Cannot attach notes to unvalidated device data SpO2 - Cannot attach notes to unvalidated device data   Patient complains of chest pain, mid chest and SOB. Pulse Ox good, lungs clear

## 2014-08-10 NOTE — Progress Notes (Signed)
HD #2 27 wks and 3 days with DI/Di twin pregnancy PPROM of Baby A  Subjective: pt without complaints.. She has continued small amount of fluid leaking. She denies contractions. +FM. No vaginal bleeding.   Afebrile Vital Signs reviewed and stable  Temp 97.8 HR 102 resp 20 BP 116/70 General Alert and Oriented  Lungs Good respiratory effort noted.  Abdomen Gravid Nontender  Ext no edema SCD's in place   FHR Baby A baseline 120's moderate variability  Accelerations present no decelerations           Baby B baseline 130's moderate variability Accelerations present no decelerations   Toco No contractions  U/S from maternal fetal care Twin A Transverse head maternal right  EFW 1001 grams ( 2lbs 3 oz ) 44 %ile  Twin B trasnverse head maternal right EFW1014 grams ( 2 lbs 4 oz )  46 %ile   A/P 27 wks and 3 days with Di/Di twins PPROM of twin A  Continue Ampicillin for total of 48 hours.  Start Amoxicillin after ampicillin for 5 days of therapy BMZ complete 4/8/at 1130 am  D/c Magnesium sulfate at 1130 am on 08/11/2014   No signs or symptoms of chorio [facility] Dr. Alesia Richards covering after 7pm today.   Dr. Simona Huh covering 08/11/2014  Starting at 7 am

## 2014-08-10 NOTE — Progress Notes (Signed)
Patient eating her snack and requests monitor belts be removed for a short time. She complains they are very uncomfortable and feeling restrictive.  Belts removed while eating and will be replaced once meal complete.

## 2014-08-10 NOTE — Progress Notes (Signed)
Husband flushed a urine before being measured.  Reviewed importance of measuring and made sure collection hat in toilet.

## 2014-08-11 MED ORDER — SODIUM CHLORIDE 0.9 % IV SOLN: 250.0000 mL | INTRAVENOUS | Status: DC | PRN

## 2014-08-11 MED ORDER — SODIUM CHLORIDE 0.9 % IJ SOLN: 3.0000 mL | Freq: Two times a day (BID) | INTRAMUSCULAR | Status: DC

## 2014-08-11 MED ORDER — SODIUM CHLORIDE 0.9 % IJ SOLN: 3.0000 mL | INTRAMUSCULAR | Status: DC | PRN

## 2014-08-11 MED ORDER — SODIUM CHLORIDE 0.9 % IJ SOLN
3.0000 mL | Freq: Two times a day (BID) | INTRAMUSCULAR | Status: DC
  Administered 2014-08-11 – 2014-09-01 (×42): 3 mL via INTRAVENOUS

## 2014-08-11 NOTE — Progress Notes (Signed)
IV to SL and d/c cont toco and do with NST And prn

## 2014-08-11 NOTE — Progress Notes (Addendum)
HD #3 27 wks and 4 days with DI/Di twin pregnancy PPROM of Baby A  Subjective: Pt without complaints.. She has continued small amount of fluid leaking. She denies contractions. +FM x 2. No vaginal bleeding.   AFVSS Gen:  NAD, A&O x3 Lungs: CTA Bilaterally  Abdomen Gravid, No fundal tenderness  REV:QWQV in place   FHR Baby A baseline 120's moderate variability, Accelerations present, occasional mild variable          Baby B baseline 130's moderate variability Accelerations present, spontaneous deceleration x1 mild variable occasionaly   Toco No contractions  U/S from maternal fetal care Twin A Transverse head maternal right  EFW 1001 grams ( 2lbs 3 oz ) 44 %ile  Twin B trasnverse head maternal right [id number] grams ( 2 lbs 4 oz )  46 %ile   A/P 27 wks and 4 days, Di/Di twin gestation PPROM of twin A -No s/sxs of chorioamnionitis. Reassuring fetal status x 2. S/p Ampicillin for total of 48 hours.   Amoxicillin Day 1 of 5 days.  BMZ complete 4/8/at 1130 am  Magnesium turned off within the hour.  Continue tocometry for 2-3 hours, then NST q shift.  Transverse presentation x 2.  Discussed cesarean section for route of delivery. Continue inpatient bedrest with bathroom privileges.  Reviewed s/sxs of PTL.

## 2014-08-12 LAB — TYPE AND SCREEN
ABO/RH(D): O POS
Antibody Screen: NEGATIVE

## 2014-08-12 NOTE — Progress Notes (Signed)
HD #4  27 wks and 5 days DI/Di twin pregnancy PPROM of Baby A  Subjective: Pt without complaints.Marland Kitchen "Braxton Hicks" contractions overnight, resolved with po hydration per pt. +FM x 2. No vaginal bleeding or pinkish discharge  AFVSS Gen:  NAD, A&O x3, Upright on bedside, ready to shower Abdomen Gravid, No fundal tenderness  Ext:No edema or calf tenderness  FHR Baby A baseline normal moderate variability, Accelerations present, occasional mild variable          Baby B baseline normal moderate variability Accelerations present occasionaly   Toco No contractions   A/P  27 wks and 5 days  Di/Di twin gestation PPROM of twin A -No s/sxs of chorioamnionitis or labor. Reassuring fetal status x 2. S/p Ampicillin for total of 48 hours.   Amoxicillin Day 2 of 5 days.  BMZ complete 08/11/14.  Transverse presentation x 2.  Cesarean section for route of delivery.  Continue inpatient bedrest with bathroom privileges.  Discussed importance of DVT prophylaxis.

## 2014-08-13 NOTE — Progress Notes (Signed)
HD #5 27 wks and 6 days DI/Di twin pregnancy PPROM of Baby A  Subjective: Pt without complaints.. Denies contractions overnight.  +FM x 2. No vaginal bleeding or pinkish discharge.  Pt states her 1 hour Glucola was scheduled to be done tomorrow at the office.  AFVSS Gen:  NAD, A&O x3, Upright on bedside, ready to shower Abdomen Gravid, No fundal tenderness  Ext:No edema or calf tenderness, SCDs in place  FHR Baby A baseline normal, moderate variability, Accelerations present, no decelerations          Baby B baseline normal, moderate variability Accelerations present, no decelerations   Toco No contractions   A/P  27 wks and 5 days  Di/Di twin gestation PPROM of twin A -No s/sxs of chorioamnionitis or labor. Reassuring fetal status x 2. S/p Ampicillin for total of 48 hours.   Amoxicillin- Day 3 of 5 days.  BMZ complete 08/11/14.  Transverse presentation x 2.  Cesarean section for route of delivery.  Continue inpatient bedrest with bathroom privileges.  Discussed importance of DVT prophylaxis. NST TID.   Routine prenatal care-Glucola, HIV and RPR tomorrow as previously scheduled. Dr. Landry Mellow to resume care tomorrow at 0700.

## 2014-08-14 LAB — GLUCOSE TOLERANCE, 1 HOUR: Glucose, 1 Hour GTT: 145 mg/dL — ABNORMAL HIGH (ref 70–140)

## 2014-08-14 NOTE — Progress Notes (Signed)
Ur chart review completed.  

## 2014-08-14 NOTE — Progress Notes (Signed)
HD # 6 28 wks and 0 days DI/Di twin pregnancy PPROM of Baby A diagnosed 08/09/2014  Subjective: Pt without complaints.Marland Kitchen + FM x 2 still leaking fluid no contractions no vaginal bleeding.   AFVSS Gen: NAD, A&O x3,  Abdomen Gravid,  nontender Ext:No edema or calf tenderness  FHR Baby A baseline 130 moderate variability, Accelerations present, no decelerations   Baby B baseline130l moderate variability Accelerations present  No decelerations   Toco No contractions   A/P  28 wks and 0 days  Di/Di twin gestation PPROM of twin A -No s/sxs of chorioamnionitis or labor. Reassuring fetal status x 2. S/p Ampicillin for total of 48 hours.  Amoxicillin Day 4 of 5 days.  BMZ complete 08/11/14.  Transverse presentation x 2. Cesarean section for route of delivery. glucola today Continue inpatient bedrest with bathroom privileges.

## 2014-08-15 LAB — HIV ANTIBODY (ROUTINE TESTING W REFLEX): HIV Screen 4th Generation wRfx: NONREACTIVE

## 2014-08-15 LAB — TYPE AND SCREEN
ABO/RH(D): O POS
ANTIBODY SCREEN: NEGATIVE

## 2014-08-15 LAB — RPR: RPR Ser Ql: NONREACTIVE

## 2014-08-15 NOTE — Progress Notes (Signed)
From 1635-1800 2 RN's at bedside adjusting [place] monitor to obtain FHR tracing for baby "A". Unable to get continuous tracing. Notified Dr. Murriel Hopper. Landry Mellow at bedside and perfomed bedside U/S- FHR tracing obtained and monitor applied for a NST.

## 2014-08-15 NOTE — Progress Notes (Signed)
HD # 7 28 wks and 1 days DI/Di twin pregnancy PPROM of Baby A diagnosed 08/09/2014  Subjective: Pt without complaints.Marland Kitchen + FM x 2 still leaking fluid no contractions no vaginal bleeding.   AFVSS Gen: NAD, A&O x3,  Abdomen Gravid, nontender Ext:No edema or calf tenderness  FHR Baby A baseline 130 moderate variability, Accelerations present, no decelerations   Baby B baseline130l moderate variability Accelerations present No decelerations   Toco No contractions   A/P  28 wks and 1days  Di/Di twin gestation PPROM of twin A -No s/sxs of chorioamnionitis or labor. Reassuring fetal status x 2. S/p Ampicillin for total of 48 hours.  Amoxicillin Day 5 of 5 days.  BMZ complete 08/11/14.  Transverse presentation x 2. Cesarean section for route of delivery.  Abnormal Glucola 145 .Marland Kitchen Plan 3 hour GTT on 07/17/2014 Continue inpatient bedrest with bathroom privileges.  [facility] covering after 7 pm today.. Dr. Nelda Marseille covering 08/16/2014 at 7 am

## 2014-08-16 NOTE — Progress Notes (Signed)
HD # 8 28 wks and 2 days DI/Di twin pregnancy PPROM of Baby A diagnosed 08/09/2014  Subjective: Pt without complaints.Marland Kitchen + FM x 2 still leaking fluid no contractions no vaginal bleeding.  No F/C/CP/SOB.  AFVSS Gen: NAD, A&O x3 CV: RRR Lungs; CTAB Abdomen Gravid, nontender Ext:No edema or calf tenderness bilaterally  FHR Baby A baseline 140 moderate variability, Accelerations present, no decelerations   Baby B baseline145l moderate variability Accelerations present No decelerations   Toco No contractions   A/P 34yo [id number]@ [redacted]w[redacted]d admitted for PPROM of Twin A, with di/di twins. -FWB:  Reassuring fetal status x 2, continue with NST q shift Transverse presentation x 2. Cesarean section for route of delivery  -PPROM S/p Ampicillin for total of 48 hours and Amoxicillin x 5 days BMZ complete 08/11/14 No s/sxs of chorioamnionitis or labor.  -Antepartum care  Abnormal Glucola 145 .Marland Kitchen Plan 3 hour GTT on 07/17/2014 Continue inpatient bedrest with bathroom privileges.   Janyth Pupa, DO [phone] (pager) [phone] (office)

## 2014-08-16 NOTE — Progress Notes (Signed)
Fetal strip for twin B intermittent tracings due to Hiccups and fetal movement.  RN at bedside holding monitor for 30 mins with palpable and audible fetal movement.  Fetal heartrate above and at umbilicus.

## 2014-08-17 LAB — GLUCOSE, FASTING GESTATIONAL: Glucose Tolerance, Fasting: 95 mg/dL

## 2014-08-17 LAB — GLUCOSE, 2 HOUR GESTATIONAL: GLUCOSE, 2 HOUR-GESTATIONAL: 170 mg/dL — AB (ref 70–164)

## 2014-08-17 LAB — GLUCOSE, 1 HOUR GESTATIONAL: GLUCOSE, 1 HOUR-GESTATIONAL: 165 mg/dL (ref 70–189)

## 2014-08-17 LAB — GLUCOSE, 3 HOUR GESTATIONAL: Glucose, GTT - 3 Hour: 122 mg/dL (ref 70–144)

## 2014-08-17 NOTE — Progress Notes (Signed)
Normal 3 hour GTT.

## 2014-08-17 NOTE — Progress Notes (Signed)
Ur chart review completed.  

## 2014-08-17 NOTE — Progress Notes (Signed)
HD # 9 28 wks and 3 days DI/Di twin pregnancy PPROM of Baby A   Subjective: Pt without complaints. Slight leaking.  Pinkish discharge yesterday but none this morning.  Pt is in 2nd hour of 3 hour GTT.  Some nausea but no vomiting.  AFVSS Gen: NAD, A&O x3 CV: RRR Lungs; CTAB Abdomen Gravid, nontender Ext:No edema or calf tenderness bilaterally  FHR Baby A baseline 140s moderate variability, Accelerations present, mild variables  Baby B baseline140s moderate variability, Accelerations present, mild variables   Toco No contractions   A/P 34yo [id number]@ [redacted]w[redacted]d admitted for PPROM of Twin A, with di/di twins. -FWB:  Reassuring fetal status x 2, continue with NST q shift Transverse presentation x 2. Cesarean section for route of delivery  -PPROM S/p Ampicillin for total of 48 hours and Amoxicillin x 5 days BMZ complete 08/11/14 No s/sxs of chorioamnionitis or labor.  -Antepartum care  Abnormal Glucola 145.  3 hour GTT in progress. If abnormal, will start CBG TID, diabetic educator consult. Continue inpatient bedrest with bathroom privileges.  Ultrasound for growth 3-4 weeks from admission.

## 2014-08-17 NOTE — Progress Notes (Signed)
Antenatal Nutrition Assessment:  Currently  28 3/[redacted] weeks gestation, with PROM, Twins. Height  67 "  Weight 228 lbs  pre-pregnancy weight 213 lbs .  Pre-pregnancy  BMI 33.4  IBW 135 lbs Total weight gain 15.lbs Weight gain goals 35-45  lbs Estimated needs: 2300-2400 kcal/day, 115-125 grams protein/day, 2.5 liters fluid/day  Regular diet tolerated well, appetite good. Current diet prescription will provide for increased needs. May order double protein portions, snacks TID and from retail  Nutrition related labs: 3 Hr GTT 95/165/170/122  One elevated value  Nutrition Dx: Increased nutrient needs r/t pregnancy and fetal growth requirements aeb 28 week twin gestation.  No educational needs assessed at this time.  Weyman Rodney M.Fredderick Severance LDN Neonatal Nutrition Support Specialist/RD III Pager [phone]

## 2014-08-18 LAB — TYPE AND SCREEN
ABO/RH(D): O POS
ANTIBODY SCREEN: NEGATIVE

## 2014-08-18 NOTE — Progress Notes (Signed)
HD # 10 28 wks and 4 days DI/Di twin pregnancy PPROM of Baby A   Subjective: Pt without complaints. Slight leaking.  Pinkish discharge yesterday but none this morning.  Pt is in 2nd hour of 3 hour GTT.  Some nausea but no vomiting.  AFVSS Gen: NAD, A&O x3 CV: RRR Lungs; CTAB Abdomen Gravid, nontender Ext:No edema or calf tenderness bilaterally  FHR Baby A baseline 140 moderate variability, Accelerations present, no decels  Baby B baseline145 moderate variability, Accelerations present, no decels  Toco No contractions SVE: Deferred   A/P 34VQ [id number]@ [redacted]w[redacted]d admitted for PPROM of Twin A, with di/di twins. -FWB:  Reassuring fetal status x 2, continue with NST q shift Transverse presentation x 2. Cesarean section for route of delivery  -PPROM S/p Ampicillin for total of 48 hours and Amoxicillin x 5 days BMZ complete 08/11/14 No s/sxs of chorioamnionitis or labor.  -Antepartum care  Abnormal Glucola 145.  3 hour within normal limits Continue inpatient bedrest with bathroom privileges- ok to shower and for wheelchair rides Ultrasound for growth 3-4 weeks from admission. [facility] to cover this weekend  Janyth Pupa, [place] [phone] (pager) [phone] (office)

## 2014-08-19 NOTE — Progress Notes (Addendum)
Hospital day # 10 pregnancy at [redacted]w[redacted]d--Twin IUP, PPROM of Twin A, admitted on 08/09/14, transverse lie both twins  S:  Doing well--friend visiting.  Denies N/V, good bowel function      Perception of contractions: None      Vaginal bleeding: None       Vaginal discharge:  Small amount pinkish leaking last night.    O: BP 125/65 mmHg  Pulse 93  Temp(Src) 97.6 F (36.4 C) (Oral)  Resp 18  Ht 5\' 7"  (1.702 m)  Wt 103.828 kg (228 lb 14.4 oz)  BMI 35.84 kg/m2  SpO2 98%  LMP 01/28/2014      Fetal tracings:  Both moderate variability, occasional quick variables      Contractions:   Occasional irritability      Uterus non-tender      Extremities: no significant edema and no signs of DVT          Labs:  T&S q 3 days--last one 08/18/14       Meds:  . docusate sodium  100 mg Oral Daily  . famotidine  20 mg Oral Daily  . prenatal multivitamin  1 tablet Oral Q1200  . sodium chloride  3 mL Intravenous Q12H  Completed latency antibiotic courses  A: [redacted]w[redacted]d with twins, PPROM Twin A, transverse lie bilaterally     Stable  P: Continue current plan of care      Upcoming tests/treatments:  NST  q shift      MDs will follow      [place] planned q 3-4 weeks from admission  Donnel Saxon CNM, [place] 08/19/2014 11:18 AM  Agree with above

## 2014-08-20 NOTE — Progress Notes (Signed)
Hospital day # 11 pregnancy at [redacted]w[redacted]d--Twin IUP, PPROM of Twin A, admitted on 08/09/14, transverse lie both twins   S:  Perception of contractions: none      Vaginal bleeding: none now       Vaginal discharge:  no significant change  O: BP 112/68 mmHg  Pulse 110  Temp(Src) 97.9 F (36.6 C) (Oral)  Resp 18  Ht 5\' 7"  (1.702 m)  Wt 228 lb 14.4 oz (103.828 kg)  BMI 35.84 kg/m2  SpO2 98%  LMP 01/28/2014      Fetal tracings:A & B, FHT 145. Moderate variability x2, appropriate for GA      Contractions:  none       Uterus gravid and non-tender      Extremities: no significant edema and no signs of DVT          Labs:  No results found for this or any previous visit (from the past 24 hour(s)).       Meds:  Current facility-administered medications:  .  0.9 %  sodium chloride infusion, 250 mL, Intravenous, PRN, Thurnell Lose, MD .  acetaminophen (TYLENOL) tablet 650 mg, 650 mg, Oral, Q4H PRN, Seabron Spates, CNM, 650 mg at 08/15/14 2012 .  calcium carbonate (TUMS - dosed in mg elemental calcium) chewable tablet 400 mg of elemental calcium, 2 tablet, Oral, Q4H PRN, Seabron Spates, CNM, 400 mg of elemental calcium at 08/10/14 1841 .  docusate sodium (COLACE) capsule 100 mg, 100 mg, Oral, Daily, Seabron Spates, CNM, 100 mg at 08/20/14 1007 .  famotidine (PEPCID) tablet 20 mg, 20 mg, Oral, Daily, Jennifer Ozan, DO, 20 mg at 08/20/14 1007 .  lactated ringers infusion, , Intravenous, Continuous, Seabron Spates, CNM, Last Rate: 100 mL/hr at 08/11/14 0457 .  prenatal multivitamin tablet 1 tablet, 1 tablet, Oral, Q1200, Seabron Spates, CNM, 1 tablet at 08/20/14 1007 .  sodium chloride 0.9 % injection 3 mL, 3 mL, Intravenous, Q12H, Thurnell Lose, MD, 3 mL at 08/20/14 1008 .  sodium chloride 0.9 % injection 3 mL, 3 mL, Intravenous, PRN, Thurnell Lose, MD .  zolpidem (AMBIEN) tablet 5 mg, 5 mg, Oral, QHS PRN, Seabron Spates, CNM  A: [redacted]w[redacted]d withTwin IUP, PPROM of Twin A, admitted on 08/09/14,  transverse lie both twins     stable     Fetal tracings: Category 1      Contractions: none     Uterus non-tender      Extremities: DTR 1+, no clonus, none edema  P: Continue current plan of care      Upcoming tests/treatments:  NST q shift      Consult with Dr. Mancel Bale       MDs will follow    Ricka Westra, CNM, MSN 08/20/2014. 12:23 PM

## 2014-08-21 LAB — TYPE AND SCREEN
ABO/RH(D): O POS
ANTIBODY SCREEN: NEGATIVE

## 2014-08-21 MED ORDER — ENSURE ENLIVE PO LIQD
237.0000 mL | Freq: Every day | ORAL | Status: DC
  Administered 2014-08-21 – 2014-08-31 (×8): 237 mL via ORAL
  Filled 2014-08-21 (×14): qty 237

## 2014-08-21 NOTE — Progress Notes (Signed)
HD # 12 29 wks and 0 days DI/Di twin pregnancy PPROM of Baby A   Subjective: Pt without complaints. Still leaking pinkish fluid. She denies contractions. FM x2.    AFVSS Gen: NAD, A&O x3 Abdomen Gravid, nontender Ext:No edema or calf tenderness bilaterally SCD's in place   FHR Baby A baseline 140 moderate variability, Accelerations present, no decelerations ( Reassuring)   Baby B baseline145 moderate variability, Accelerations present,  Variable decelerations but reassuring  Toco No contractions SVE: Deferred   A/P 33yo [id number]@ 29 wks and o days  admitted for PPROM of Twin A, with di/di twins. -FWB:  Reassuring fetal status x 2, continue with NST TID  Transverse presentation x 2. Cesarean section for route of delivery  -PPROM S/p Ampicillin for total of 48 hours and Amoxicillin x 5 days BMZ complete 08/11/14 No s/sxs of chorioamnionitis or labor.  -Antepartum care Abnormal Glucola 145. 3 hour within normal limits Continue inpatient bedrest with bathroom privileges- ok to shower and for wheelchair rides Ultrasound for growth 3-4 weeks from admission.

## 2014-08-21 NOTE — Progress Notes (Signed)
Ur chart review completed.  

## 2014-08-22 ENCOUNTER — Ambulatory Visit (HOSPITAL_COMMUNITY): Admission: RE | Admit: 2014-08-22 | Payer: Managed Care, Other (non HMO) | Source: Ambulatory Visit

## 2014-08-22 NOTE — Progress Notes (Signed)
HD # 13 29 wks and 1 day DI/Di twin pregnancy PPROM of Baby A   Subjective: Pt without complaints. Still leaking pinkish fluid. She denies contractions. FM x2.    AFVSS Gen: NAD, A&O x3 Abdomen Gravid, nontender Ext:No edema or calf tenderness bilaterally SCD's in place   FHR Baby A baseline 140 moderate variability, Accelerations present, no decelerations ( Reassuring)   Baby B baseline145 moderate variability, Accelerations present, Variable decelerations but reassuring  Toco No contractions SVE: Deferred   A/P 33yo [id number]@ 29 wks and 1 day admitted for PPROM of Twin A, with di/di twins. -FWB:  Reassuring fetal status x 2, continue with NST  q shift  Transverse presentation x 2. Cesarean section for route of delivery  -PPROM S/p Ampicillin for total of 48 hours and Amoxicillin x 5 days BMZ complete 08/11/14 No s/sxs of chorioamnionitis or labor.  -Antepartum care Abnormal Glucola 145. 3 hour within normal limits Continue inpatient bedrest with bathroom privileges- ok to shower and for wheelchair rides Ultrasound for growth/ AFI scheduled for 08/30/2014 [facility] covering after 7 pm this evening.

## 2014-08-23 NOTE — Progress Notes (Signed)
HD # 14 29 wks and 2 day DI/Di twin pregnancy PPROM of Baby A   Subjective: Pt without complaints. Still leaking pinkish fluid. She denies contractions. FM x2.    AFVSS Gen: NAD, A&O x3 Abdomen Gravid, nontender Ext:No edema or calf tenderness bilaterally SCD's in place   FHR Baby A baseline 140 moderate variability, Accelerations present, variable decelerations ( but  Reassuring)   Baby B baseline145 moderate variability, Accelerations present, Variable decelerations (  but reassuring) Toco No contractions SVE: Deferred   A/P 33yo [id number]@ 29 wks and 2 day admitted for PPROM of Twin A, with di/di twins. -FWB:  Reassuring fetal status x 2, continue with NST q shift  Transverse presentation x 2. Cesarean section for route of delivery  -PPROM S/p Ampicillin for total of 48 hours and Amoxicillin x 5 days BMZ complete 08/11/14 No s/sxs of chorioamnionitis or labor.  -Antepartum care Abnormal Glucola 145. 3 hour within normal limits Continue inpatient bedrest with bathroom privileges- ok to shower and for wheelchair rides Ultrasound for growth/ AFI scheduled for 08/30/2014  Dr. Simona Huh covering after 5 pm this afternoon.

## 2014-08-23 NOTE — Progress Notes (Signed)
Ur chart review completed.  

## 2014-08-23 NOTE — Plan of Care (Signed)
Problem: Phase I Progression Outcomes Goal: LOS < 4 days Outcome: Not Met (add Reason) Patient admitted to antenatal for prolonged hospitalization due to PROM     

## 2014-08-24 LAB — TYPE AND SCREEN
ABO/RH(D): O POS
Antibody Screen: NEGATIVE

## 2014-08-24 MED ORDER — FERROUS SULFATE 325 (65 FE) MG PO TABS
325.0000 mg | ORAL_TABLET | Freq: Two times a day (BID) | ORAL | Status: DC
  Administered 2014-08-25 – 2014-09-02 (×17): 325 mg via ORAL
  Filled 2014-08-24 (×17): qty 1

## 2014-08-24 NOTE — Progress Notes (Signed)
HD # 15 29 wks and 3 day DI/Di twin pregnancy PPROM of Baby A   Subjective: Pt without complaints. Still leaking pinkish fluid. She denies contractions. FM x2.    AFVSS Gen: NAD, A&O x3 Abdomen Gravid, nontender Ext:No edema or calf tenderness bilaterally SCD's in place   FHR Baby A baseline 140 moderate variability, Accelerations present, variable decelerations ( but Reassuring)   Baby B baseline140 moderate variability, Accelerations present, Variable decelerations ( but reassuring) Toco No contractions SVE: Deferred   A/P 33yo [id number]@ 29 wks and 3 day admitted for PPROM of Twin A, with di/di twins. -FWB:  Reassuring fetal status x 2, continue with NST q shift  Transverse presentation x 2. Cesarean section for route of delivery  -PPROM S/p Ampicillin for total of 48 hours and Amoxicillin x 5 days BMZ complete 08/11/14 No s/sxs of chorioamnionitis or labor.  -Antepartum care Abnormal Glucola 145. 3 hour within normal limits Continue inpatient bedrest with bathroom privileges- ok to shower and for wheelchair rides Ultrasound for growth/ AFI scheduled for 08/30/2014 Dr. Simona Huh covering after 5 pm this afternoon.

## 2014-08-25 LAB — CBC
HCT: 30.2 % — ABNORMAL LOW (ref 36.0–46.0)
Hemoglobin: 9.9 g/dL — ABNORMAL LOW (ref 12.0–15.0)
MCH: 28.5 pg (ref 26.0–34.0)
MCHC: 32.8 g/dL (ref 30.0–36.0)
MCV: 87 fL (ref 78.0–100.0)
PLATELETS: 230 10*3/uL (ref 150–400)
RBC: 3.47 MIL/uL — ABNORMAL LOW (ref 3.87–5.11)
RDW: 16.2 % — AB (ref 11.5–15.5)
WBC: 7.9 10*3/uL (ref 4.0–10.5)

## 2014-08-25 NOTE — Progress Notes (Signed)
Called to bedside to evaluate for increased leakage of blood-tinged flood.    S: Patient reports no contractions or pressure.  +FM x2  O: BP 117/69 mmHg  Pulse 98  Temp(Src) 98.5 F (36.9 C) (Oral)  Resp 18  Ht 5\' 7"  (1.702 m)  Wt 103.193 kg (227 lb 8 oz)  BMI 35.62 kg/m2  SpO2 98%  LMP 01/28/2014  FHT: Twin A: 140, moderate variability, +10x10 accels, variable decel x1 Twin B: 145, moderate variability, +10x10 accels, no decels Toco: no contractions SSE: Cervix visualized: closed long, + bloody amniotic fluid noted in vault ~ 10cc BSUS: Twin A: transverse presentation  A/P: 33yo [id number]@ 29 wks and 4 day admitted for PPROM of Twin A, with di/di twins. -FWB:  Reassuring fetal status x 2, continue with NST q shift  Transverse presentation x 2. Cesarean section for route of delivery  -PPROM S/p Ampicillin for total of 48 hours and Amoxicillin x 5 days BMZ complete 08/11/14 No s/sxs of chorioamnionitis or labor. Reassuring fetal status.  Pt ruled out for labor as there are no contractions on Toco and no cervical change.  -Antepartum care Abnormal Glucola 145. 3 hour within normal limits Continue inpatient bedrest with bathroom privileges- ok to shower and for wheelchair rides Ultrasound for growth/ AFI scheduled for 08/30/2014.  DISPO:No evidence of labor on examination, will continue to closely monitor.  Janyth Pupa, DO [phone] (pager) [phone] (office)

## 2014-08-25 NOTE — Progress Notes (Signed)
HD # 16 29 wks and 4 day DI/Di twin pregnancy PPROM of Baby A   Subjective: Pt without complaints. Still leaking, clear fluid. She denies contractions. FM x2.    AFVSS Gen: NAD, A&O x3 Abdomen Gravid, nontender Ext:No edema or calf tenderness bilaterally SCD's off  FHR Baby A baseline 140 moderate variability, Accelerations present, variable decelerations ( but Reassuring)   Baby B baseline140 moderate variability, Accelerations present, Variable decelerations ( but reassuring) Toco No contractions SVE: Deferred   A/P 33yo [id number]@ 29 wks and 4 day admitted for PPROM of Twin A, with di/di twins. -FWB:  Reassuring fetal status x 2, continue with NST q shift  Transverse presentation x 2. Cesarean section for route of delivery  -PPROM S/p Ampicillin for total of 48 hours and Amoxicillin x 5 days BMZ complete 08/11/14 No s/sxs of chorioamnionitis or labor. Reassuring fetal status.  -Antepartum care Abnormal Glucola 145. 3 hour within normal limits Continue inpatient bedrest with bathroom privileges- ok to shower and for wheelchair rides Ultrasound for growth/ AFI scheduled for 08/30/2014.  Dr. Nelda Marseille  covering  this afternoon and the weekend.

## 2014-08-25 NOTE — Progress Notes (Signed)
MD informed of pt status and FHR tracing. Per MD continue to monitor for contractions for the complete duration of 2 hours.

## 2014-08-25 NOTE — Progress Notes (Signed)
Dr. Nelda Marseille updated on pt status. Pt still denies any pain. Dr.Ozan to come over and evaluate pt and perform a speculum exam.

## 2014-08-25 NOTE — Progress Notes (Signed)
Pt off the monitor after reassurring FHR and pt established not to be in active labor. Pt instructed by MD to inform the RN is she started having any discomfort, cramping,ect.

## 2014-08-25 NOTE — Progress Notes (Signed)
Called to the room with pt c/o increased bloody fluid noted on peripad. Pt denies any pain and placed back on the monitor for evaluation of fetal heart rates

## 2014-08-26 NOTE — Progress Notes (Addendum)
OB progress Note, HD #18  S: Patient resting comfortably in bed, reports no acute complaints.  Still occasional LOF, but minimal.  No contractions/abdominal pain.  No vaginal bleeding.  +FM x2.  No F/C/CP/SOB.  No headache or dizziness.  O: BP 126/85 mmHg  Pulse 110  Temp(Src) 97.7 F (36.5 C) (Oral)  Resp 20  Ht 5\' 7"  (1.702 m)  Wt 103.193 kg (227 lb 8 oz)  BMI 35.62 kg/m2  SpO2 98%  LMP 01/28/2014  FHT: Twin A: 140, moderate variability, +accels, no decels Twin B: 150, moderate variability, + accels, no decels Toco: no contactions  Gen: NAD  CV: RRR-no tachycardia appreciated Lungs: CTAB Abd: gravid, soft and non-tender Ext: SCDs in place, no calf tenderness bilaterally, no edema  A/P: 33yo [id number]@ 29 wks and 6 day admitted for PPROM of Twin A, with di/di twins. -FWB:  Reassuring fetal status x 2, continue with NST q shift  Transverse presentation x 2. Cesarean section for route of delivery  -PPROM S/p Ampicillin for total of 48 hours and Amoxicillin x 5 days BMZ complete 08/11/14 No s/sxs of chorioamnionitis or labor. Reassuring fetal status.   -Antepartum care Abnormal Glucola 145. 3 hour within normal limits Continue inpatient bedrest with bathroom privileges- ok to shower and for wheelchair rides Ultrasound for growth/ AFI scheduled for 08/30/2014.  Janyth Pupa, DO [phone] (pager) [phone] (office)

## 2014-08-26 NOTE — Progress Notes (Addendum)
OB progress Note, HD #17  S: Patient resting comfortably in bed, reports no acute complaints.  Still having small gushes of fluid, but more pink-tinged.  No contractions/abdominal pain.  No vaginal bleeding.  +FM x2  O: BP 121/64 mmHg  Pulse 102  Temp(Src) 97.9 F (36.6 C) (Oral)  Resp 20  Ht 5\' 7"  (1.702 m)  Wt 103.193 kg (227 lb 8 oz)  BMI 35.62 kg/m2  SpO2 98%  LMP 01/28/2014  FHT: Twin A: 140, moderate variability, +accels, no decels Twin B: 145, moderate variability, + accels, no decels Toco: no contractions  Gen: NAD  CV: RRR Lungs: CTAB Abd: gravid, soft and non-tender Ext: SCDs in place, no calf tenderness bilaterally, no edema  A/P: 33yo [id number]@ 29 wks and 5 day admitted for PPROM of Twin A, with di/di twins. -FWB:  Reassuring fetal status x 2, continue with NST q shift  Transverse presentation x 2. Cesarean section for route of delivery  -PPROM S/p Ampicillin for total of 48 hours and Amoxicillin x 5 days BMZ complete 08/11/14 No s/sxs of chorioamnionitis or labor. Reassuring fetal status.   -Antepartum care Abnormal Glucola 145. 3 hour within normal limits Continue inpatient bedrest with bathroom privileges- ok to shower and for wheelchair rides Ultrasound for growth/ AFI scheduled for 08/30/2014.  Janyth Pupa, DO [phone] (pager) [phone] (office)

## 2014-08-27 LAB — TYPE AND SCREEN
ABO/RH(D): O POS
Antibody Screen: NEGATIVE

## 2014-08-27 NOTE — Progress Notes (Signed)
Both baby A & B on monitor

## 2014-08-27 NOTE — Plan of Care (Signed)
Problem: Phase I Progression Outcomes Goal: LOS < 4 days Outcome: Not Met (add Reason) Patient on antenatal for prolonged hospitalization due to PROM

## 2014-08-27 NOTE — Progress Notes (Signed)
Nurse at bedside baby A difficulty to obtain FHR, requested another nurse to locate FHR.

## 2014-08-28 NOTE — Progress Notes (Signed)
HD # 19 30 wks and 0 day DI/Di twin pregnancy PPROM of Baby A   Subjective: Pt without complaints. Still leaking pinkish fluid.  She denies bright red bleeding. She denies abdominal pain or tenderness... She denies contractions.  She reports FM x2.    AFVSS Gen: NAD, A&O x3 Abdomen Gravid, nontender Ext:No edema or calf tenderness bilaterally SCD's in place   FHR Baby A baseline 140 moderate variability, Accelerations present,  No decelerations ( Reassuring)   Baby B baseline140 moderate variability, Accelerations present, Variable decel noted with quick return to baseline  ( reassuring) Toco No contractions SVE: Deferred   A/P 33yo [id number]@ 30 wks and 0 day admitted for PPROM of Twin A, with di/di twins. -FWB:  Reassuring fetal status x 2, continue with NST q shift  Transverse presentation x 2. Cesarean section for route of delivery  -PPROM S/p Ampicillin for total of 48 hours and Amoxicillin x 5 days BMZ complete 08/11/14 No s/sxs of chorioamnionitis or labor.  -Antepartum care Abnormal Glucola 145. 3 hour within normal limits Continue inpatient bedrest with bathroom privileges- ok to shower and for wheelchair rides Ultrasound for growth/ AFI scheduled for 08/30/2014 [facility] ( [facility] [place] covering this evening after 7 pm)

## 2014-08-28 NOTE — Progress Notes (Signed)
Ur chart review completed.  

## 2014-08-29 NOTE — Progress Notes (Addendum)
HD # 20 30 wks and1 day DI/Di twin pregnancy PPROM of Baby A   Subjective: Pt without complaints. Still leaking pinkish fluid. She denies bright red bleeding. She denies abdominal pain or tenderness... She denies contractions. She reports FM x2.    AFVSS Gen: NAD, A&O x3 Abdomen Gravid, nontender Ext:No edema or calf tenderness bilaterally SCD's in place   FHR Baby A baseline 140 moderate variability, Accelerations present,occasional variable decel with quick return to baseline ( Reassuring)   Baby B baseline140 moderate variability, Accelerations present,no decelertions noted.  ( reassuring) Toco irritability  Noted resolved with repositioning  Per Nurse  SVE: Deferred   A/P 33yo [id number]@ 30 wks and 1 day admitted for PPROM of Twin A, with di/di twins. -FWB:  Reassuring fetal status x 2, continue with NST q shift  Transverse presentation x 2. Cesarean section for route of delivery  -PPROM S/p Ampicillin for total of 48 hours and Amoxicillin x 5 days BMZ complete 08/11/14 No s/sxs of chorioamnionitis or labor.  -Antepartum care Abnormal Glucola 145. 3 hour within normal limits Continue inpatient bedrest with bathroom privileges- ok to shower and for wheelchair rides Ultrasound for growth/ AFI scheduled for 08/30/2014 [facility] ( central [place] covering this evening after 7 pm)HD # 19

## 2014-08-30 ENCOUNTER — Ambulatory Visit (HOSPITAL_COMMUNITY): Payer: Managed Care, Other (non HMO) | Attending: Obstetrics and Gynecology

## 2014-08-30 DIAGNOSIS — Z3A30 30 weeks gestation of pregnancy: Secondary | ICD-10-CM | POA: Insufficient documentation

## 2014-08-30 DIAGNOSIS — O30043 Twin pregnancy, dichorionic/diamniotic, third trimester: Secondary | ICD-10-CM | POA: Insufficient documentation

## 2014-08-30 DIAGNOSIS — D259 Leiomyoma of uterus, unspecified: Secondary | ICD-10-CM | POA: Insufficient documentation

## 2014-08-30 DIAGNOSIS — O4103X1 Oligohydramnios, third trimester, fetus 1: Secondary | ICD-10-CM | POA: Insufficient documentation

## 2014-08-30 DIAGNOSIS — O3413 Maternal care for benign tumor of corpus uteri, third trimester: Secondary | ICD-10-CM | POA: Insufficient documentation

## 2014-08-30 LAB — TYPE AND SCREEN
ABO/RH(D): O POS
Antibody Screen: NEGATIVE

## 2014-08-30 MED ORDER — BACITRACIN-NEOMYCIN-POLYMYXIN OINTMENT TUBE
TOPICAL_OINTMENT | Freq: Two times a day (BID) | CUTANEOUS | Status: DC
  Administered 2014-08-30 – 2014-09-02 (×6): via TOPICAL
  Filled 2014-08-30: qty 15

## 2014-08-30 MED ORDER — BACITRACIN-NEOMYCIN-POLYMYXIN 400-5-5000 EX OINT
TOPICAL_OINTMENT | Freq: Two times a day (BID) | CUTANEOUS | Status: DC
  Filled 2014-08-30 (×2): qty 1

## 2014-08-30 NOTE — Progress Notes (Signed)
TC to Dr Landry Mellow to report pt's complaint of open wound on bottom of rt foot draining bloody exudate.  Wound size of pencil eraser in dia.  Pt stated that she had pedicure prior to admission of hospital.  Pt requesting neosporin dressing.  Orders given for aneorobic culture, warm compress dry dressing with neosporin ointment.

## 2014-08-30 NOTE — Progress Notes (Signed)
HD # 21 30 wks and 2 days DI/Di twin pregnancy PPROM of Baby A   Subjective: Pt without complaints. Still leaking pinkish fluid. She denies bright red bleeding. She denies abdominal pain or tenderness... She denies contractions. She reports FM x2.    AFVSS Gen: NAD, A&O x3 Abdomen Gravid, nontender Ext:No edema or calf tenderness bilaterally SCD's in place   FHR Baby A baseline 130 moderate variability, Accelerations present, no decelerations. (reassuring)  Baby B baseline130 moderate variability, Accelerations present, variable decelerations noted rare with quick return to baseline. ( reassuring) Toco no contractions  SVE: Deferred   08/30/2014 Ultrasound results pending.   A/P 33yo [id number]@ 30 wks and 1 day admitted for PPROM of Twin A, with di/di twins. -FWB:  Reassuring fetal status x 2, continue with NST q shift  Transverse presentation x 2. Cesarean section for route of delivery  -PPROM S/p Ampicillin for total of 48 hours and Amoxicillin x 5 days BMZ complete 08/11/14 No s/sxs of chorioamnionitis or labor.  -Antepartum care Abnormal Glucola 145. 3 hour within normal limits Continue inpatient bedrest with bathroom privileges- ok to shower and for wheelchair rides Will follow ultrasound results.

## 2014-08-31 NOTE — Progress Notes (Addendum)
HD # 22 30 wks and 3 days DI/Di twin pregnancy PPROM of Baby A   Subjective: Pt without complaints. Discharge is now clear, no longer pink.  Denies contractions.  Good fetal movement x 2.    AFVSS Gen: NAD, A&O x3, Sitting in chair working on computer Abdomen Gravid, nontender Ext:No edema or calf tenderness  Last 4 NSTs reviewed. FHR Baby A baseline 130 moderate variability, Accelerations present, occasional mild variable. (reassuring)  Baby B baseline130 moderate variability, Accelerations present, occasional mild variableToco no contractions  SVE: Deferred   08/30/2014  Baby A Breech EFW 19%, AFI 3.9, Baby B Transverse EFW 17% AFI normal AC lagging on both babies.  Normal Dopplers.  BPP 8/8.     A/P 33yo [id number]@ 30 wks and 3 day admitted for PPROM of Twin A, with di/di twins.  -FWB:  Reassuring fetal tracing. Approriate growth but velocity has slowed and AC is lagging. Fetal dopplers recommended once weekly.  Fetal kick counts BID. Reviewed MFM recommendations with pt. Continue with NST q shift  Breech/Transverse presentation. Cesarean section for route of delivery  -PPROM S/p Ampicillin for total of 48 hours and Amoxicillin x 5 days BMZ complete 08/11/14 No s/sxs of chorioamnionitis or labor.  -Antepartum care Abnormal Glucola 145. 3 hour within normal limits Continue inpatient bedrest with bathroom privileges- ok to shower and for wheelchair rides  Dr. Landry Mellow to resume care tomorrow am.

## 2014-08-31 NOTE — Plan of Care (Signed)
Problem: Phase II Progression Outcomes Goal: Progress activity as tolerated unless otherwise ordered Outcome: Progressing Wheelchair priviledges

## 2014-09-01 NOTE — Progress Notes (Signed)
HD # 23 30 wks and 4 days DI/Di twin pregnancy PPROM of Baby A   Subjective:  Pt report sore on bottom of right foot is no longer draining. It is not painful.  Discharge is now clear, no longer pink. No vaginal bleeding.  Denies contractions. Good fetal movement x 2.    AF VSS Gen: NAD, A&O x3,  Abdomen Gravid, non tender Ext:No edema or calf tenderness  Right foot with 1 cm callus nontender no evidence of infection    FHR Baby A baseline 140 moderate variability, Accelerations present, occasional mild variable. (reassuring) Baby B baseline130 moderate variability, Accelerations present, occasional mild variable (reassuring) Toco no contractions    08/30/2014 Ultrasound  Baby A Breech EFW 19%, AFI 3.9,  Baby B Transverse EFW 17% AFI normal AC lagging on both babies at 8 %ile . Normal Dopplers. BPP 8/8.    A/P 33yo [id number]@ 30 wks and 4 day admitted for PPROM of Twin A, with di/di twins.  -FWB:  Reassuring fetal tracing. Approriate growth but velocity has slowed and AC is lagging. Fetal dopplers recommended once weekly.   Reviewed MFM recommendations with pt. Continue with NST q shift  Breech/Transverse presentation. Cesarean section for route of delivery  -PPROM S/p Ampicillin for total of 48 hours and Amoxicillin x 5 days BMZ complete 08/11/14 No s/sxs of chorioamnionitis or labor.  -Antepartum care Abnormal Glucola 145. 3 hour within normal limits Continue inpatient bedrest with bathroom privileges- ok to shower and for wheelchair rides  Right foot callus... No evidence of infection.Marland Kitchen Apply neosporin prn .

## 2014-09-02 ENCOUNTER — Encounter (HOSPITAL_COMMUNITY)
Admission: AD | Disposition: A | Discharge status: Home / Self Care | Payer: Self-pay | Source: Ambulatory Visit | Attending: Obstetrics and Gynecology

## 2014-09-02 LAB — WOUND CULTURE

## 2014-09-02 LAB — CBC
HCT: 30.3 % — ABNORMAL LOW (ref 36.0–46.0)
Hemoglobin: 10.2 g/dL — ABNORMAL LOW (ref 12.0–15.0)
MCH: 29.5 pg (ref 26.0–34.0)
MCHC: 33.7 g/dL (ref 30.0–36.0)
MCV: 87.6 fL (ref 78.0–100.0)
PLATELETS: 201 10*3/uL (ref 150–400)
RBC: 3.46 MIL/uL — ABNORMAL LOW (ref 3.87–5.11)
RDW: 16.7 % — AB (ref 11.5–15.5)
WBC: 11 10*3/uL — ABNORMAL HIGH (ref 4.0–10.5)

## 2014-09-02 LAB — TYPE AND SCREEN
ABO/RH(D): O POS
Antibody Screen: NEGATIVE

## 2014-09-02 SURGERY — Surgical Case
Anesthesia: Regional

## 2014-09-02 MED ORDER — TERBUTALINE SULFATE 1 MG/ML IJ SOLN: 0.2500 mg | Freq: Once | INTRAMUSCULAR | Status: DC

## 2014-09-02 MED ORDER — ONDANSETRON HCL 4 MG/2ML IJ SOLN
INTRAMUSCULAR | Status: AC
  Filled 2014-09-02: qty 2

## 2014-09-02 MED ORDER — NIFEDIPINE 10 MG PO CAPS
10.0000 mg | ORAL_CAPSULE | ORAL | Status: AC | PRN
  Administered 2014-09-02 (×4): 10 mg via ORAL
  Filled 2014-09-02 (×4): qty 1

## 2014-09-02 MED ORDER — FENTANYL CITRATE (PF) 100 MCG/2ML IJ SOLN
INTRAMUSCULAR | Status: AC
  Filled 2014-09-02: qty 2

## 2014-09-02 MED ORDER — SCOPOLAMINE 1 MG/3DAYS TD PT72
MEDICATED_PATCH | TRANSDERMAL | Status: AC
  Filled 2014-09-02: qty 1

## 2014-09-02 MED ORDER — LACTATED RINGERS IV BOLUS (SEPSIS)
500.0000 mL | Freq: Once | INTRAVENOUS | Status: AC
  Administered 2014-09-02: 500 mL via INTRAVENOUS

## 2014-09-02 MED ORDER — DEXAMETHASONE SODIUM PHOSPHATE 4 MG/ML IJ SOLN
INTRAMUSCULAR | Status: AC
  Filled 2014-09-02: qty 1

## 2014-09-02 MED ORDER — PHENYLEPHRINE 8 MG IN D5W 100 ML (0.08MG/ML) PREMIX OPTIME
INJECTION | INTRAVENOUS | Status: AC
  Filled 2014-09-02: qty 100

## 2014-09-02 MED ORDER — PROMETHAZINE HCL 25 MG/ML IJ SOLN
12.5000 mg | Freq: Once | INTRAMUSCULAR | Status: AC
  Administered 2014-09-02: 12.5 mg via INTRAVENOUS
  Filled 2014-09-02: qty 1

## 2014-09-02 MED ORDER — MORPHINE SULFATE 0.5 MG/ML IJ SOLN
INTRAMUSCULAR | Status: AC
  Filled 2014-09-02: qty 10

## 2014-09-02 MED ORDER — LACTATED RINGERS IV SOLN
INTRAVENOUS | Status: DC
  Administered 2014-09-02: 20:00:00 via INTRAVENOUS

## 2014-09-02 MED ORDER — OXYTOCIN 10 UNIT/ML IJ SOLN
INTRAMUSCULAR | Status: AC
  Filled 2014-09-02: qty 4

## 2014-09-02 MED ORDER — CITRIC ACID-SODIUM CITRATE 334-500 MG/5ML PO SOLN
ORAL | Status: AC
  Administered 2014-09-03: 30 mL
  Filled 2014-09-02: qty 15

## 2014-09-02 MED ORDER — BUTORPHANOL TARTRATE 1 MG/ML IJ SOLN
1.0000 mg | Freq: Once | INTRAMUSCULAR | Status: AC
  Administered 2014-09-02: 1 mg via INTRAVENOUS
  Filled 2014-09-02: qty 1

## 2014-09-02 SURGICAL SUPPLY — 36 items
BENZOIN TINCTURE PRP APPL 2/3 (GAUZE/BANDAGES/DRESSINGS) IMPLANT
CLAMP CORD UMBIL (MISCELLANEOUS) IMPLANT
CLOSURE WOUND 1/2 X4 (GAUZE/BANDAGES/DRESSINGS)
CLOTH BEACON ORANGE TIMEOUT ST (SAFETY) IMPLANT
CONTAINER PREFILL 10% NBF 15ML (MISCELLANEOUS) IMPLANT
DRAIN JACKSON PRT FLT 10 (DRAIN) IMPLANT
DRAPE SHEET LG 3/4 BI-LAMINATE (DRAPES) IMPLANT
DRSG OPSITE POSTOP 4X10 (GAUZE/BANDAGES/DRESSINGS) IMPLANT
DURAPREP 26ML APPLICATOR (WOUND CARE) IMPLANT
ELECT REM PT RETURN 9FT ADLT (ELECTROSURGICAL)
ELECTRODE REM PT RTRN 9FT ADLT (ELECTROSURGICAL) IMPLANT
EVACUATOR SILICONE 100CC (DRAIN) IMPLANT
EXTRACTOR VACUUM M CUP 4 TUBE (SUCTIONS) IMPLANT
EXTRACTOR VACUUM M CUP 4' TUBE (SUCTIONS)
GLOVE BIO SURGEON STRL SZ 6.5 (GLOVE) IMPLANT
GLOVE BIO SURGEONS STRL SZ 6.5 (GLOVE)
GLOVE BIOGEL PI IND STRL 7.0 (GLOVE) IMPLANT
GLOVE BIOGEL PI INDICATOR 7.0 (GLOVE)
GOWN STRL REUS W/TWL LRG LVL3 (GOWN DISPOSABLE) IMPLANT
KIT ABG SYR 3ML LUER SLIP (SYRINGE) IMPLANT
NEEDLE HYPO 25X5/8 SAFETYGLIDE (NEEDLE) IMPLANT
NS IRRIG 1000ML POUR BTL (IV SOLUTION) IMPLANT
PACK C SECTION WH (CUSTOM PROCEDURE TRAY) IMPLANT
PAD OB MATERNITY 4.3X12.25 (PERSONAL CARE ITEMS) IMPLANT
RTRCTR C-SECT PINK 25CM LRG (MISCELLANEOUS) IMPLANT
STRIP CLOSURE SKIN 1/2X4 (GAUZE/BANDAGES/DRESSINGS) IMPLANT
SUT CHROMIC 0 CT 1 (SUTURE) IMPLANT
SUT MNCRL AB 3-0 PS2 27 (SUTURE) IMPLANT
SUT PLAIN 2 0 (SUTURE)
SUT PLAIN 2 0 XLH (SUTURE) IMPLANT
SUT PLAIN ABS 2-0 CT1 27XMFL (SUTURE) IMPLANT
SUT SILK 2 0 SH (SUTURE) IMPLANT
SUT VIC AB 0 CTX 36 (SUTURE)
SUT VIC AB 0 CTX36XBRD ANBCTRL (SUTURE) IMPLANT
TOWEL OR 17X24 6PK STRL BLUE (TOWEL DISPOSABLE) IMPLANT
TRAY FOLEY CATH SILVER 14FR (SET/KITS/TRAYS/PACK) IMPLANT

## 2014-09-02 NOTE — Progress Notes (Addendum)
Hospital day # 24 pregnancy at [redacted]w[redacted]d--Di/Di twins, PPROM Twin A 08/09/14  S:  Sleeping at present--RN reports patient noted onset of mild cramping around 6am, with patient placed on toco at that time.  O: BP 130/66 mmHg  Pulse 89  Temp(Src) 98.3 F (36.8 C) (Oral)  Resp 20  Ht 5\' 7"  (1.702 m)  Wt 103.828 kg (228 lb 14.4 oz)  BMI 35.84 kg/m2  SpO2 100%  LMP 01/28/2014      Fetal tracings:  Category 1      Contractions:   UCs from 6a to approx 8a, mild, q 3-4 min, now resolved, with patient sleeping through  Toco removed 9a--previous contractions resolved.               Labs: T&S done 08/30/14.  Wound cultures negative from foot       Meds:  . docusate sodium  100 mg Oral Daily  . famotidine  20 mg Oral Daily  . feeding supplement (ENSURE ENLIVE)  237 mL Oral QPC lunch  . ferrous sulfate  325 mg Oral BID WC  . neomycin-bacitracin-polymyxin   Topical BID  . prenatal multivitamin  1 tablet Oral Q1200  . sodium chloride  3 mL Intravenous Q12H    08/30/2014 Ultrasound  Baby A Breech EFW 19%, AFI 3.9, Baby B Transverse EFW 17% AFI normal AC lagging on both babies at 8 %ile . Normal Dopplers. BPP 8/8  A: [redacted]w[redacted]d, di/di twins     PPROM Twin A 08/09/14     ? Evolving IUGR     Breech/transverse presentation     GBS negative 08/09/14     Completed ATBs for latency     Betamethasone course completed 08/11/14     Stable  P: Continue current plan of care      Upcoming tests/treatments:  T&S q 72 hours, weekly dopplers (due 09/06/14), NST q shift or prn      MDs will follow  Donnel Saxon CNM, [place] 09/02/2014 9:38 AM   Addendum: Patient now awake, just finished shower. Denies significant leaking. Had single cramp while in shower, none since.  Reviewed s/s of advancing labor. She will CTO and notify with any change in status.  Donnel Saxon, CNM 09/02/14 1140   Pt seen and examined.  She is stable maintain current care.

## 2014-09-02 NOTE — Progress Notes (Addendum)
Courtney Farmer [id number]  Subjective: Nurse call reporting patient c/o contractions.  Strip and Chart Reviewed.  In room to assess.  Patient reports contractions since 3 am, but recently have increased in intensity and frequency.  Patient reports continued LOF, with some pinkish show.  Patient denies rectal pressure/urge to push and reports active fetuses.    Objective:  Filed Vitals:   09/02/14 0553 09/02/14 0800 09/02/14 1205 09/02/14 1614  BP: 102/81 130/66 122/70 125/81  Pulse: 85 89 108 114  Temp: 98.2 F (36.8 C) 98.3 F (36.8 C) 98.2 F (36.8 C) 98.8 F (37.1 C)  TempSrc: Oral Oral Oral Oral  Resp: 18 20 18 20   Height:      Weight:      SpO2:       Physical Exam: SVE: 0/90/-2 No Bloody Show Clear fluid noted  FHR: 150 bpm, Mod Var, +Var Decels, +Accels 150 bpm, Min Var, -Decels, -Accels UC: Q29min, palpates mild  Assessment: TIUP at [redacted]w[redacted]d Cat I FT pPROM Contractions  Plan: -Patient informed of need to stop contractions and POC to include: -Start IV give 500 LR bolus, f/b 125 continued infusion -CBC -Type and Screen current -Dr. Sophronia Simas consulted and advised as below -Given procardia per [facility] protocol -If contractions not resolved by 4 dose; -Give IV stadol and phenergan  [name], CNM 09/02/2014 7:41 PM  Addendum 2045 Patient reports contractions still present. Nurse instructed to give 3rd dose of procardia Dr. Sophronia Simas updated on patient status  Addendum 2200 Nurse reports patient states contractions remain the same Advised to give stadol and phenergan  [name], [name] MSN, CNM 10:20 PM   Pt sleeping and no longer feeling her contractions Will monitor closely

## 2014-09-02 NOTE — Progress Notes (Signed)
Dr. Charlesetta Garibaldi informed of variable decels noted on EFM tracing.  MD states she will view tracing.

## 2014-09-02 NOTE — Progress Notes (Signed)
Pt c/o mucousy discharged described as mucous plug, pt informed no need for concern but will inform CNM.  Windy Kalata, CNM notified.

## 2014-09-02 NOTE — Progress Notes (Signed)
Courtney Farmer, CNM states toco may be removed d/t pt sleeping through UI.

## 2014-09-03 ENCOUNTER — Encounter (HOSPITAL_COMMUNITY): Payer: Self-pay | Admitting: Anesthesiology

## 2014-09-03 ENCOUNTER — Inpatient Hospital Stay (HOSPITAL_COMMUNITY): Payer: Managed Care, Other (non HMO) | Admitting: Anesthesiology

## 2014-09-03 ENCOUNTER — Encounter (HOSPITAL_COMMUNITY)
Admission: AD | Disposition: A | Discharge status: Home / Self Care | Payer: Self-pay | Source: Ambulatory Visit | Attending: Obstetrics and Gynecology

## 2014-09-03 ENCOUNTER — Encounter (HOSPITAL_COMMUNITY): Payer: Self-pay | Admitting: Pediatrics

## 2014-09-03 LAB — CBC
HEMATOCRIT: 29.9 % — AB (ref 36.0–46.0)
HEMOGLOBIN: 10.4 g/dL — AB (ref 12.0–15.0)
MCH: 30 pg (ref 26.0–34.0)
MCHC: 34.8 g/dL (ref 30.0–36.0)
MCV: 86.2 fL (ref 78.0–100.0)
Platelets: 221 10*3/uL (ref 150–400)
RBC: 3.47 MIL/uL — ABNORMAL LOW (ref 3.87–5.11)
RDW: 16.3 % — AB (ref 11.5–15.5)
WBC: 19.4 10*3/uL — ABNORMAL HIGH (ref 4.0–10.5)

## 2014-09-03 SURGERY — Surgical Case
Anesthesia: Spinal | Site: Abdomen

## 2014-09-03 MED ORDER — NALOXONE HCL 0.4 MG/ML IJ SOLN: 0.4000 mg | INTRAMUSCULAR | Status: DC | PRN

## 2014-09-03 MED ORDER — SIMETHICONE 80 MG PO CHEW
80.0000 mg | CHEWABLE_TABLET | ORAL | Status: DC
  Administered 2014-09-04 – 2014-09-05 (×3): 80 mg via ORAL
  Filled 2014-09-03 (×3): qty 1

## 2014-09-03 MED ORDER — SIMETHICONE 80 MG PO CHEW
80.0000 mg | CHEWABLE_TABLET | Freq: Three times a day (TID) | ORAL | Status: DC
  Administered 2014-09-03 – 2014-09-06 (×10): 80 mg via ORAL
  Filled 2014-09-03 (×9): qty 1

## 2014-09-03 MED ORDER — ZOLPIDEM TARTRATE 5 MG PO TABS: 5.0000 mg | ORAL_TABLET | Freq: Every evening | ORAL | Status: DC | PRN

## 2014-09-03 MED ORDER — BUPIVACAINE IN DEXTROSE 0.75-8.25 % IT SOLN
INTRATHECAL | Status: DC | PRN
  Administered 2014-09-03: 1.6 mL via INTRATHECAL

## 2014-09-03 MED ORDER — WITCH HAZEL-GLYCERIN EX PADS: 1.0000 "application " | MEDICATED_PAD | CUTANEOUS | Status: DC | PRN

## 2014-09-03 MED ORDER — MENTHOL 3 MG MT LOZG: 1.0000 | LOZENGE | OROMUCOSAL | Status: DC | PRN

## 2014-09-03 MED ORDER — PHENYLEPHRINE 8 MG IN D5W 100 ML (0.08MG/ML) PREMIX OPTIME
INJECTION | INTRAVENOUS | Status: DC | PRN
  Administered 2014-09-03: 60 ug/min via INTRAVENOUS

## 2014-09-03 MED ORDER — FERROUS SULFATE 325 (65 FE) MG PO TABS
325.0000 mg | ORAL_TABLET | Freq: Two times a day (BID) | ORAL | Status: DC
  Administered 2014-09-03 – 2014-09-06 (×7): 325 mg via ORAL
  Filled 2014-09-03 (×7): qty 1

## 2014-09-03 MED ORDER — ACETAMINOPHEN 325 MG PO TABS: 650.0000 mg | ORAL_TABLET | ORAL | Status: DC | PRN

## 2014-09-03 MED ORDER — MEPERIDINE HCL 25 MG/ML IJ SOLN: 6.2500 mg | INTRAMUSCULAR | Status: DC | PRN

## 2014-09-03 MED ORDER — BISACODYL 10 MG RE SUPP: 10.0000 mg | Freq: Every day | RECTAL | Status: DC | PRN

## 2014-09-03 MED ORDER — DIPHENHYDRAMINE HCL 25 MG PO CAPS: 25.0000 mg | ORAL_CAPSULE | ORAL | Status: DC | PRN

## 2014-09-03 MED ORDER — SCOPOLAMINE 1 MG/3DAYS TD PT72
1.0000 | MEDICATED_PATCH | Freq: Once | TRANSDERMAL | Status: DC
  Filled 2014-09-03: qty 1

## 2014-09-03 MED ORDER — NALBUPHINE HCL 10 MG/ML IJ SOLN: 5.0000 mg | Freq: Once | INTRAMUSCULAR | Status: AC | PRN

## 2014-09-03 MED ORDER — OXYTOCIN 10 UNIT/ML IJ SOLN
40.0000 [IU] | INTRAVENOUS | Status: DC | PRN
  Administered 2014-09-03: 40 [IU] via INTRAVENOUS

## 2014-09-03 MED ORDER — DEXAMETHASONE SODIUM PHOSPHATE 4 MG/ML IJ SOLN
INTRAMUSCULAR | Status: DC | PRN
  Administered 2014-09-03: 4 mg via INTRAVENOUS

## 2014-09-03 MED ORDER — FENTANYL CITRATE (PF) 100 MCG/2ML IJ SOLN
INTRAMUSCULAR | Status: DC | PRN
  Administered 2014-09-03: 10 ug via INTRATHECAL

## 2014-09-03 MED ORDER — OXYTOCIN 40 UNITS IN LACTATED RINGERS INFUSION - SIMPLE MED: 62.5000 mL/h | INTRAVENOUS | Status: AC

## 2014-09-03 MED ORDER — FLEET ENEMA 7-19 GM/118ML RE ENEM: 1.0000 | ENEMA | Freq: Every day | RECTAL | Status: DC | PRN

## 2014-09-03 MED ORDER — HYDROMORPHONE HCL 1 MG/ML IJ SOLN: 0.2500 mg | INTRAMUSCULAR | Status: DC | PRN

## 2014-09-03 MED ORDER — ONDANSETRON HCL 4 MG/2ML IJ SOLN: 4.0000 mg | Freq: Three times a day (TID) | INTRAMUSCULAR | Status: DC | PRN

## 2014-09-03 MED ORDER — CEFAZOLIN SODIUM-DEXTROSE 2-3 GM-% IV SOLR
INTRAVENOUS | Status: DC | PRN
  Administered 2014-09-03: 2 g via INTRAVENOUS

## 2014-09-03 MED ORDER — LACTATED RINGERS IV SOLN
INTRAVENOUS | Status: DC
  Administered 2014-09-03: 11:00:00 via INTRAVENOUS

## 2014-09-03 MED ORDER — MEASLES, MUMPS & RUBELLA VAC ~~LOC~~ INJ
0.5000 mL | INJECTION | Freq: Once | SUBCUTANEOUS | Status: DC
  Filled 2014-09-03: qty 0.5

## 2014-09-03 MED ORDER — LANOLIN HYDROUS EX OINT: 1.0000 "application " | TOPICAL_OINTMENT | CUTANEOUS | Status: DC | PRN

## 2014-09-03 MED ORDER — TETANUS-DIPHTH-ACELL PERTUSSIS 5-2.5-18.5 LF-MCG/0.5 IM SUSP
0.5000 mL | Freq: Once | INTRAMUSCULAR | Status: AC
  Administered 2014-09-04: 0.5 mL via INTRAMUSCULAR
  Filled 2014-09-03: qty 0.5

## 2014-09-03 MED ORDER — NALBUPHINE HCL 10 MG/ML IJ SOLN: 5.0000 mg | INTRAMUSCULAR | Status: DC | PRN

## 2014-09-03 MED ORDER — NALOXONE HCL 1 MG/ML IJ SOLN
1.0000 ug/kg/h | INTRAVENOUS | Status: DC | PRN
  Filled 2014-09-03: qty 2

## 2014-09-03 MED ORDER — PROMETHAZINE HCL 25 MG/ML IJ SOLN
6.2500 mg | INTRAMUSCULAR | Status: DC | PRN
Start: 2014-09-03 — End: 2014-09-03

## 2014-09-03 MED ORDER — DIBUCAINE 1 % RE OINT: 1.0000 "application " | TOPICAL_OINTMENT | RECTAL | Status: DC | PRN

## 2014-09-03 MED ORDER — OXYCODONE-ACETAMINOPHEN 5-325 MG PO TABS: 2.0000 | ORAL_TABLET | ORAL | Status: DC | PRN

## 2014-09-03 MED ORDER — SIMETHICONE 80 MG PO CHEW: 80.0000 mg | CHEWABLE_TABLET | ORAL | Status: DC | PRN

## 2014-09-03 MED ORDER — KETOROLAC TROMETHAMINE 30 MG/ML IJ SOLN: 30.0000 mg | Freq: Four times a day (QID) | INTRAMUSCULAR | Status: DC | PRN

## 2014-09-03 MED ORDER — IBUPROFEN 600 MG PO TABS
600.0000 mg | ORAL_TABLET | Freq: Four times a day (QID) | ORAL | Status: DC
  Administered 2014-09-03 – 2014-09-06 (×13): 600 mg via ORAL
  Filled 2014-09-03 (×14): qty 1

## 2014-09-03 MED ORDER — SENNOSIDES-DOCUSATE SODIUM 8.6-50 MG PO TABS
2.0000 | ORAL_TABLET | ORAL | Status: DC
  Administered 2014-09-04 – 2014-09-05 (×3): 2 via ORAL
  Filled 2014-09-03 (×3): qty 2

## 2014-09-03 MED ORDER — SCOPOLAMINE 1 MG/3DAYS TD PT72
MEDICATED_PATCH | TRANSDERMAL | Status: DC | PRN
  Administered 2014-09-03: 1 via TRANSDERMAL

## 2014-09-03 MED ORDER — DIPHENHYDRAMINE HCL 50 MG/ML IJ SOLN: 12.5000 mg | INTRAMUSCULAR | Status: DC | PRN

## 2014-09-03 MED ORDER — DIPHENHYDRAMINE HCL 25 MG PO CAPS: 25.0000 mg | ORAL_CAPSULE | Freq: Four times a day (QID) | ORAL | Status: DC | PRN

## 2014-09-03 MED ORDER — IBUPROFEN 600 MG PO TABS: 600.0000 mg | ORAL_TABLET | Freq: Four times a day (QID) | ORAL | Status: DC | PRN

## 2014-09-03 MED ORDER — ONDANSETRON HCL 4 MG/2ML IJ SOLN
INTRAMUSCULAR | Status: DC | PRN
  Administered 2014-09-03: 4 mg via INTRAVENOUS

## 2014-09-03 MED ORDER — CEFAZOLIN SODIUM-DEXTROSE 2-3 GM-% IV SOLR
INTRAVENOUS | Status: AC
Start: 2014-09-03 — End: 2014-09-03
  Filled 2014-09-03: qty 50

## 2014-09-03 MED ORDER — OXYCODONE-ACETAMINOPHEN 5-325 MG PO TABS: 1.0000 | ORAL_TABLET | ORAL | Status: DC | PRN

## 2014-09-03 MED ORDER — MORPHINE SULFATE (PF) 0.5 MG/ML IJ SOLN
INTRAMUSCULAR | Status: DC | PRN
  Administered 2014-09-03: .2 mg via INTRATHECAL

## 2014-09-03 MED ORDER — KETOROLAC TROMETHAMINE 30 MG/ML IJ SOLN
30.0000 mg | Freq: Once | INTRAMUSCULAR | Status: DC | PRN
Start: 2014-09-03 — End: 2014-09-03

## 2014-09-03 MED ORDER — SODIUM CHLORIDE 0.9 % IJ SOLN: 3.0000 mL | INTRAMUSCULAR | Status: DC | PRN

## 2014-09-03 MED ORDER — PRENATAL MULTIVITAMIN CH
1.0000 | ORAL_TABLET | Freq: Every day | ORAL | Status: DC
  Administered 2014-09-03 – 2014-09-05 (×3): 1 via ORAL
  Filled 2014-09-03 (×4): qty 1

## 2014-09-03 MED ORDER — PHENYLEPHRINE HCL 10 MG/ML IJ SOLN
INTRAMUSCULAR | Status: DC | PRN
  Administered 2014-09-03: 80 ug via INTRAVENOUS

## 2014-09-03 SURGICAL SUPPLY — 36 items
BENZOIN TINCTURE PRP APPL 2/3 (GAUZE/BANDAGES/DRESSINGS) ×3 IMPLANT
CLAMP CORD UMBIL (MISCELLANEOUS) ×12 IMPLANT
CLOSURE WOUND 1/2 X4 (GAUZE/BANDAGES/DRESSINGS) ×1
CLOTH BEACON ORANGE TIMEOUT ST (SAFETY) ×3 IMPLANT
CONTAINER PREFILL 10% NBF 15ML (MISCELLANEOUS) IMPLANT
DRAIN JACKSON PRT FLT 10 (DRAIN) IMPLANT
DRAPE SHEET LG 3/4 BI-LAMINATE (DRAPES) ×6 IMPLANT
DRSG OPSITE POSTOP 4X10 (GAUZE/BANDAGES/DRESSINGS) ×3 IMPLANT
DURAPREP 26ML APPLICATOR (WOUND CARE) ×3 IMPLANT
ELECT REM PT RETURN 9FT ADLT (ELECTROSURGICAL) ×3
ELECTRODE REM PT RTRN 9FT ADLT (ELECTROSURGICAL) ×1 IMPLANT
EVACUATOR SILICONE 100CC (DRAIN) IMPLANT
EXTRACTOR VACUUM M CUP 4 TUBE (SUCTIONS) IMPLANT
EXTRACTOR VACUUM M CUP 4' TUBE (SUCTIONS)
GLOVE BIO SURGEON STRL SZ 6.5 (GLOVE) ×4 IMPLANT
GLOVE BIO SURGEONS STRL SZ 6.5 (GLOVE) ×2
GLOVE BIOGEL PI IND STRL 7.0 (GLOVE) ×2 IMPLANT
GLOVE BIOGEL PI INDICATOR 7.0 (GLOVE) ×4
GOWN STRL REUS W/TWL LRG LVL3 (GOWN DISPOSABLE) ×6 IMPLANT
KIT ABG SYR 3ML LUER SLIP (SYRINGE) ×18 IMPLANT
NEEDLE HYPO 25X5/8 SAFETYGLIDE (NEEDLE) ×18 IMPLANT
NS IRRIG 1000ML POUR BTL (IV SOLUTION) ×3 IMPLANT
PACK C SECTION WH (CUSTOM PROCEDURE TRAY) ×3 IMPLANT
PAD OB MATERNITY 4.3X12.25 (PERSONAL CARE ITEMS) ×3 IMPLANT
RTRCTR C-SECT PINK 25CM LRG (MISCELLANEOUS) ×3 IMPLANT
STRIP CLOSURE SKIN 1/2X4 (GAUZE/BANDAGES/DRESSINGS) ×2 IMPLANT
SUT CHROMIC 0 CT 1 (SUTURE) ×3 IMPLANT
SUT MNCRL AB 3-0 PS2 27 (SUTURE) ×3 IMPLANT
SUT PLAIN 2 0 (SUTURE) ×4
SUT PLAIN 2 0 XLH (SUTURE) ×3 IMPLANT
SUT PLAIN ABS 2-0 CT1 27XMFL (SUTURE) ×2 IMPLANT
SUT SILK 2 0 SH (SUTURE) IMPLANT
SUT VIC AB 0 CTX 36 (SUTURE) ×8
SUT VIC AB 0 CTX36XBRD ANBCTRL (SUTURE) ×4 IMPLANT
TOWEL OR 17X24 6PK STRL BLUE (TOWEL DISPOSABLE) ×3 IMPLANT
TRAY FOLEY CATH SILVER 14FR (SET/KITS/TRAYS/PACK) ×3 IMPLANT

## 2014-09-03 NOTE — Transfer of Care (Signed)
Immediate Anesthesia Transfer of Care Note  Patient: Courtney Farmer  Procedure(s) Performed: Procedure(s): CESAREAN SECTION (N/A)  Patient Location: PACU  Anesthesia Type:Spinal  Level of Consciousness: awake, alert , oriented and patient cooperative  Airway & Oxygen Therapy: Patient Spontanous Breathing  Post-op Assessment: Report given to RN and Post -op Vital signs reviewed and stable  Post vital signs: Reviewed and stable  Last Vitals:  Filed Vitals:   09/03/14 0154  BP:   Pulse:   Temp: 37.4 C  Resp:     Complications: No apparent anesthesia complications

## 2014-09-03 NOTE — Op Note (Signed)
Cesarean Section Procedure Note   Courtney Farmer  08/09/2014 - 09/03/2014  Indications: PPROM Baby A.  2.  PTL 3. Malpresentation   Pre-operative Diagnosis: preterm twins malpresentation labor.   Post-operative Diagnosis: Same with uterine fibroids  Surgeon: Surgeon(s) and Role:    * Crawford Givens, MD - Primary   Assistants: J.Emily CNM   Anesthesia: spinal   Procedure Details:  The patient was seen in the Holding Room. The risks, benefits, complications, treatment options, and expected outcomes were discussed with the patient. The patient concurred with the proposed plan, giving informed consent. identified as Courtney Farmer and the procedure verified as C-Section Delivery. A Time Out was held and the above information confirmed.  After induction of anesthesia, the patient was draped and prepped in the usual sterile manner. A transverse incision was made and carried down through the subcutaneous tissue to the fascia. Fascial incision was made in the midline and extended transversely. The fascia was separated from the underlying rectus muscle superiorly and inferiorly. The peritoneum was identified and entered. Peritoneal incision was extended longitudinally with good visualization of bowel and bladder. The utero-vesical peritoneal reflection was incised transversely and the bladder flap was bluntly freed from the lower uterine segment.  An alexsis retractor was placed in the abdomen.   A low transverse uterine incision was made. Delivered from baby A was face, Baby B footling breech presentation was a  infant, with Apgar scores of 4 at one minute and 6 at five minutes. Cord ph was sent the umbilical cord was clamped and cut cord blood was obtained for evaluation. The placenta was removed Intact and appeared normal. The uterine outline, tubes and ovaries appeared normal}. The uterine incision was closed with running locked sutures of 0Vicryl. A second layer 0 vicrlyl was used to imbricate the  uterine incision    Hemostasis was observed. Lavage was carried out until clear. The alexsis was removed.  The peritoneum was closed with 0 chromic.  The muscles were examined and any bleeders were made hemostatic using bovie cautery device.   The fascia was then reapproximated with running sutures of 0 vicryl.  The subcutaneous tissue was reapproximated  With interrupted stitches using 2-0 plain gut. The subcuticular closure was performed using 3-90monocryl     Instrument, sponge, and needle counts were correct prior the abdominal closure and were correct at the conclusion of the case.    Findings: infant was delivered from A face presentation. Girl infant. apgars 4,6 &7. Weight 2-11.    Baby B footling breech presentation.  apgars 6,8 weight  3-1.  The fluid was clear.  The uterus tubes and ovaries appeared normal.  The uterus had a 5 cm peduculated anterior fibroid and 7 cm and subserosal fibroid.     Estimated Blood Loss: 832ml   Total IV Fluids: 1528ml   Urine Output: 400CC OF clear urine  Specimens: placentas to pathology  Complications: no complications  Disposition: PACU - hemodynamically stable.   Maternal Condition: stable   Baby condition / location:  Both infants to NICU  Attending Attestation: I performed the procedure.   Signed: Surgeon(s): Crawford Givens, MD

## 2014-09-03 NOTE — Anesthesia Preprocedure Evaluation (Signed)
Anesthesia Evaluation  Patient identified by MRN, date of birth, ID band Patient awake    Reviewed: Allergy & Precautions, H&P , NPO status , Patient's Chart, lab work & pertinent test results  Airway Mallampati: II  TM Distance: >3 FB Neck ROM: full    Dental no notable dental hx.    Pulmonary neg pulmonary ROS,    Pulmonary exam normal       Cardiovascular negative cardio ROS      Neuro/Psych negative neurological ROS  negative psych ROS   GI/Hepatic negative GI ROS, Neg liver ROS,   Endo/Other  negative endocrine ROS  Renal/GU negative Renal ROS     Musculoskeletal   Abdominal (+) + obese,   Peds  Hematology negative hematology ROS (+)   Anesthesia Other Findings   Reproductive/Obstetrics (+) Pregnancy                             Anesthesia Physical Anesthesia Plan  ASA: II and emergent  Anesthesia Plan: Spinal   Post-op Pain Management:    Induction:   Airway Management Planned:   Additional Equipment:   Intra-op Plan:   Post-operative Plan:   Informed Consent: I have reviewed the patients History and Physical, chart, labs and discussed the procedure including the risks, benefits and alternatives for the proposed anesthesia with the patient or authorized representative who has indicated his/her understanding and acceptance.     Plan Discussed with: CRNA and Surgeon  Anesthesia Plan Comments:         Anesthesia Quick Evaluation

## 2014-09-03 NOTE — Progress Notes (Addendum)
  Subjective: Postpartum Day 0: Cesarean Delivery  for twin pregnancy, PPROM and preterm labor, delivery at 6 W 6 Days.  Patient reports mild nausea, no vomiting.  Has tolerated regular by mouth. She has ambulated already. Her incisional pain is well controlled.   Objective: Vital signs in last 24 hours:  Temp of 100.1 at 4 am 09/03/14.    Temp:  [98 F (36.7 C)-100.1 F (37.8 C)] 98 F (36.7 C) (05/01 1200) Pulse Rate:  [82-114] 86 (05/01 1200) Resp:  [18-26] 18 (05/01 1200) BP: (114-139)/(57-81) 131/68 mmHg (05/01 1200) SpO2:  [95 %-100 %] 100 % (05/01 1200) I/O last 3 completed shifts: In: 1865 [P.O.:240; I.V.:1625] Out: 2025 [Urine:1225; Blood:800] Total I/O In: 1075.8 [P.O.:480; I.V.:595.8] Out: 1200 [Urine:1200]   Physical Exam:  General: alert, cooperative and no distress  CVS: s1, s2, rrr Pulm: CTAB Lochia: appropriate Uterine Fundus: firm Incision: Clean, dry, intact.  DVT Evaluation: No evidence of DVT seen on physical exam.  1+ edema bilaterally in lower extremities.    Recent Labs  09/02/14 2020 09/03/14 0822  HGB 10.2* 10.4*  HCT 30.3* 29.9*    Assessment/Plan: Status post Cesarean section Doing well postoperatively.  -Routine post op care. -close temp monitoring.  Waymon Amato Filutowski [facility] 09/03/2014, 2:08 PM

## 2014-09-03 NOTE — Anesthesia Postprocedure Evaluation (Signed)
  Anesthesia Post-op Note  Patient: Courtney Farmer  Procedure(s) Performed: Procedure(s): CESAREAN SECTION (N/A)  Patient Location: [facility]'s Unit  Anesthesia Type:Spinal  Level of Consciousness: awake, alert , oriented and patient cooperative  Airway and Oxygen Therapy: Patient Spontanous Breathing  Post-op Pain: none  Post-op Assessment: Post-op Vital signs reviewed, Patient's Cardiovascular Status Stable, Respiratory Function Stable, Patent Airway, No headache, No backache, No residual numbness and No residual motor weakness  Post-op Vital Signs: Reviewed and stable  Last Vitals:  Filed Vitals:   09/03/14 0731  BP: 131/78  Pulse: 95  Temp: 37 C  Resp: 20    Complications: No apparent anesthesia complications

## 2014-09-03 NOTE — Anesthesia Postprocedure Evaluation (Signed)
Anesthesia Post Note  Patient: Courtney Farmer  Procedure(s) Performed: Procedure(s) (LRB): CESAREAN SECTION (N/A)  Anesthesia type: Spinal  Patient location: PACU  Post pain: Pain level controlled  Post assessment: Post-op Vital signs reviewed  Last Vitals:  Filed Vitals:   09/03/14 0315  BP:   Pulse: 110  Temp:   Resp: 26    Post vital signs: Reviewed  Level of consciousness: awake  Complications: No apparent anesthesia complications

## 2014-09-03 NOTE — Lactation Note (Signed)
This note was copied from the chart of Luci Bellucci. Lactation Consultation Note  Patient Name: Courtney Farmer [id number] Date: 09/03/2014 Reason for consult: Initial assessment;NICU baby;Infant < 6lbs   Initial consult with P1 mom of twins GA 30.6 in NICU.  Mom began pumping at 14 hours after birth and reports getting drops with first pumping.   Visitors in room at time of visit with mom so was not able to review hand expression with mom.  RN or tech to teach HE.  LC emphasized reviewing video on web site in NICU booklet about Hand Expression. Return demonstration on how to set up pump on preemie setting with 3-4 teardrops (or more) based on comfort level with pumping. Reviewed NICU booklet with mom and encouraged keeping pumping log.  [place] wrote first pumping information in log for mom to continue.  Instructed to pump every 2 hours during the day and at least once at night for a minimum of 8 times per day.  Small colostrum collection containers given with numbered yellow stickers; mom has labels in room.   Encouraged STS in NICU and educated on benefits of STS.   Mom asked RN about galactogogues; LC gave printed information but encouraged mom to not take galactogogues at this time to focus on pumping.  Reviewed appropriate amounts of milk per day of life and asked mom to consult with LC.  Recipe for lactation cookies given and encouraged using recipe.   Mom has insurance with [employer]; reports she is waiting on an order from MD so she can get her pump from insurance company.  Stated this is what is required by insurance.  Plans to get a Medela DEBP from insurance, but is interested in doing a 2-week rental prior to discharge.  Rental packet given and explained $40 needed to complete rental upon discharge.   Spoke with RN about need to teach HE.     Maternal Data Formula Feeding for Exclusion: Yes Reason for exclusion: Admission to Intensive Care Unit (ICU) post-partum Does the patient  have breastfeeding experience prior to this delivery?: No  Feeding    LATCH Score/Interventions                      Lactation Tools Discussed/Used WIC Program: No   Consult Status Consult Status: Follow-up Date: 09/04/14 Follow-up type: In-patient    Merlene Laughter 09/03/2014, 4:58 PM

## 2014-09-03 NOTE — Addendum Note (Signed)
Addendum  created 09/03/14 0810 by Raenette Rover, CRNA   Modules edited: Notes Section   Notes Section:  File: [id number]

## 2014-09-03 NOTE — Anesthesia Procedure Notes (Signed)
Spinal Patient location during procedure: OR Start time: 09/03/2014 12:20 AM End time: 09/03/2014 12:23 AM Staffing Anesthesiologist: Lyn Hollingshead Performed by: anesthesiologist  Preanesthetic Checklist Completed: patient identified, surgical consent, pre-op evaluation, timeout performed, IV checked, risks and benefits discussed and monitors and equipment checked Spinal Block Patient position: sitting Prep: site prepped and draped and DuraPrep Patient monitoring: heart rate, cardiac monitor, continuous pulse ox and blood pressure Approach: midline Location: L3-4 Injection technique: single-shot Needle Needle type: Sprotte  Needle gauge: 24 G Needle length: 9 cm Needle insertion depth: 8 cm Assessment Sensory level: T4

## 2014-09-04 ENCOUNTER — Encounter (HOSPITAL_COMMUNITY): Payer: Self-pay | Admitting: Obstetrics and Gynecology

## 2014-09-04 LAB — ANAEROBIC CULTURE

## 2014-09-04 MED ORDER — MEASLES, MUMPS & RUBELLA VAC ~~LOC~~ INJ
0.5000 mL | INJECTION | Freq: Once | SUBCUTANEOUS | Status: DC
  Filled 2014-09-04: qty 0.5

## 2014-09-04 NOTE — Progress Notes (Signed)
Ur chart review completed.  

## 2014-09-04 NOTE — Lactation Note (Signed)
This note was copied from the chart of [place]. Lactation Consultation Note  Follow up visit made.  Mom states she is pumping every 3 hours but not obtaining milk yet.  Reassured mom and instructed to continue pumping/hand expression every 3 hours.  Mom denies questions/concerns.  She may need a 2 week rental if her pump does not arrive prior to discharge.  Patient Name: Courtney Farmer [id number] Date: 09/04/2014     Maternal Data    Feeding Feeding Type: Donor Breast Milk  LATCH Score/Interventions                      Lactation Tools Discussed/Used     Consult Status      Ave Filter 09/04/2014, 2:41 PM

## 2014-09-04 NOTE — Progress Notes (Signed)
Subjective: Postpartum Day 1: Cesarean Delivery Patient reports tolerating PO, + flatus and no problems voiding.    Objective: Vital signs in last 24 hours: Temp:  [97.6 F (36.4 C)-98.2 F (36.8 C)] 98.2 F (36.8 C) (05/02 1300) Pulse Rate:  [88-100] 99 (05/02 1300) Resp:  [18-20] 18 (05/02 1300) BP: (94-116)/(54-66) 112/59 mmHg (05/02 1300) SpO2:  [98 %-100 %] 98 % (05/02 1300)  Physical Exam:  General: alert and cooperative Lochia: appropriate Uterine Fundus: firm Incision: healing well DVT Evaluation: No evidence of DVT seen on physical exam.   Recent Labs  09/02/14 2020 09/03/14 0822  HGB 10.2* 10.4*  HCT 30.3* 29.9*    Assessment/Plan: Status post Cesarean section. Doing well postoperatively.  Continue current care.  Merlen Gurry J. 09/04/2014, 1:36 PM

## 2014-09-05 NOTE — Lactation Note (Signed)
This note was copied from the chart of [place]. Lactation Consultation Note  Patient Name: Courtney Farmer [id number] Date: 09/05/2014 Reason for consult: Follow-up assessment  With this mom of NICU twins, now 60 hours odl, and 31 1/7 weeks CGA. On exam, mom's breasts are full and heavy. i reivewed hand expressin with mom, and had her now use the standard setting. She pumped 48 mls of transitional milk, and was happy. I gave mom the paper work for a 2 week DEP rental, which she will do tomorrow, at discharge.    Maternal Data    Feeding Feeding Type: Donor Breast Milk  LATCH Score/Interventions       Type of Nipple: Everted at rest and after stimulation  Intervention(s): Ice;Hand expression  Problem noted: Filling;Mild/Moderate discomfort Interventions (Filling): Double electric pump        Lactation Tools Discussed/Used WIC Program: No Pump Review: Setup, frequency, and cleaning;Milk Storage;Other (comment) (switched to standard setting today)   Consult Status Consult Status: Follow-up Date: 09/06/14 Follow-up type: In-patient    Tonna Corner 09/05/2014, 1:48 PM

## 2014-09-05 NOTE — Progress Notes (Signed)
Subjective: Postpartum Day 2: Cesarean Delivery Patient reports tolerating PO, + flatus and no problems voiding.    Objective: Vital signs in last 24 hours: Temp:  [98.3 F (36.8 C)-98.9 F (37.2 C)] 98.3 F (36.8 C) (05/03 0612) Pulse Rate:  [87-105] 87 (05/03 0612) Resp:  [16-18] 16 (05/03 0612) BP: (109-116)/(46-68) 114/68 mmHg (05/03 0612) SpO2:  [100 %] 100 % (05/03 0612)  Physical Exam:  General: alert and cooperative Lochia: appropriate Uterine Fundus: firm Incision: healing well DVT Evaluation: No evidence of DVT seen on physical exam.   Recent Labs  09/02/14 2020 09/03/14 0822  HGB 10.2* 10.4*  HCT 30.3* 29.9*    Assessment/Plan: Status post Cesarean section. Doing well postoperatively.  Plan for discharge home tomorrow .  Courtney Farmer J. 09/05/2014, 4:58 PM

## 2014-09-05 NOTE — Clinical Social Work Maternal (Signed)
CLINICAL SOCIAL WORK MATERNAL/CHILD NOTE  Patient Details  Name: Courtney Farmer MRN: [id number] Date of Birth: 10/08/1980  Date:  08-02-2014  Clinical Social Worker Initiating Note:  Courtney Farmer, Courtney Farmer Date/ Time Initiated:  09/05/14/0945     Child's Name:  Courtney Farmer and Courtney Farmer   Legal Guardian:   (Parents: Courtney Farmer and Courtney Farmer)   Need for Interpreter:  None   Date of Referral:        Reason for Referral:   (No referral-NICU admission)   Referral Source:      Address:  [address] [place], [place] [address]  Phone number:  [phone]   Household Members:      Natural Supports (not living in the home):  Extended Family   Professional Supports:     Employment:     Type of Work:  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  FOB is a local truck driver.   Education:      Nutritional therapist (MOB plans to add babies to [employer], but also had questions about applying for Medicaid.  CSW provided her with [facility] Financial Counselor contact information.)   Other Resources:   (MOB asked how to apply for [facility] and daycare vouchers.  CSW provided information.)   Cultural/Religious Considerations Which May Impact Care:  None stated  Strengths:  Ability to meet basic needs , Compliance with medical plan , Understanding of illness (MOB states they have begun making preparations for babies, but do not have everything yet.  CSW instructed MOB to get a pediatrician list from the NICU nurses station, as she has not yet chosen a pediatrician.)   Risk Factors/Current Problems:  None   Cognitive State:  Alert , Linear Thinking , Goal Oriented , Insightful    Mood/Affect:  Calm , Comfortable , Interested , Relaxed    CSW Assessment: CSW met with MOB in her third floor room/319 to introduce myself, offer support and complete assessment due to NICU admission of twins at 30.6 weeks.  MOB was  pleasant and welcoming of CSW's visit.  She was up and brushing her teeth and stated that this was a good time to talk.  She sat on her bed and focused her attention on speaking with CSW.  She appears to be in good spirits and reports feeling well physically, other than a "little sore, which is to be expected."  She reports coping well emotionally at this time. CSW asked if she would share her birth story and she did so willingly.  She reports that she conceived with IVF and was admitted on 08/09/14 because [place] had a "slow leak."  She states she was preparing to be delivered at 34 weeks, but [place] decided to come on Sunday.  She states it was a long ordeal and she initially didn't know what was happening, but states she feels okay as long as "I can see them."  She reports that they are doing well at this time.   CSW evaluated support system.  MOB states both her family and her husband's live in [place].  She reports that they have been sending baby items while she has been in the hospital and that she and her husband will be getting the remainder of the items needed before babies discharge.  She states her sister-in-law lives in [place] and is supportive.  Sister-in-law and FOB will provide transportation to the hospital until MOB can  drive again.  She states they live about 15 minutes away and will have no issues with transportation.   CSW discussed common emotions related to the post partum period, especially including a NICU admission.  CSW also provided education on PPD signs and symptoms and asked that MOB talk with CSW and or her OB if symptoms arise or if she has concerns about her emotions at any time.  CSW explained ongoing support services offered by NICU CSW and provided contact information.  MOB was attentive to information given and seemed appreciative.   CSW has no social concerns at this time and provided MOB with contact information.  CSW Plan/Description:  Patient/Family Education  , Psychosocial Support and Ongoing Assessment of Needs    Courtney Reiger Elizabeth, LCSW 09/05/2014, 12:41 PM 

## 2014-09-06 ENCOUNTER — Ambulatory Visit (HOSPITAL_COMMUNITY): Payer: Managed Care, Other (non HMO)

## 2014-09-06 MED ORDER — IBUPROFEN 600 MG PO TABS: 600.0000 mg | ORAL_TABLET | Freq: Four times a day (QID) | ORAL | Status: DC | PRN

## 2014-09-06 MED ORDER — OXYCODONE-ACETAMINOPHEN 5-325 MG PO TABS: 1.0000 | ORAL_TABLET | Freq: Four times a day (QID) | ORAL | Status: DC | PRN

## 2014-09-06 MED ORDER — MEASLES, MUMPS & RUBELLA VAC ~~LOC~~ INJ
0.5000 mL | INJECTION | Freq: Once | SUBCUTANEOUS | Status: AC
  Administered 2014-09-06: 0.5 mL via SUBCUTANEOUS
  Filled 2014-09-06: qty 0.5

## 2014-09-06 NOTE — Progress Notes (Signed)
Discharge teaching complete. Pt understood all instructions and did not have any questions. Pt ambulated out of the hospital and discharged home to family.

## 2014-09-06 NOTE — Discharge Summary (Addendum)
Obstetric Discharge Summary Reason for Admission: rupture of membranes at 27 wks 1 day  Prenatal Procedures: NST and ultrasound Intrapartum Procedures: cesarean: low cervical, transverse Postpartum Procedures: none Complications-Operative and Postpartum: none HEMOGLOBIN  Date Value Ref Range Status  09/03/2014 10.4* 12.0 - 15.0 g/dL Final   HCT  Date Value Ref Range Status  09/03/2014 29.9* 36.0 - 46.0 % Final    Physical Exam:  General: alert and cooperative Lochia: appropriate Uterine Fundus: firm Incision: healing well DVT Evaluation: No evidence of DVT seen on physical exam.  Discharge Diagnoses: s/p ceasarean section  Discharge Information: Date: 09/06/2014 Activity: pelvic rest Diet: routine Medications: PNV, Ibuprofen and Percocet Condition: stable Instructions: refer to practice specific booklet Discharge to: home Follow-up Information    Follow up with Catha Brow., MD. Schedule an appointment as soon as possible for a visit in 2 weeks.   Specialty:  Obstetrics and Gynecology   Contact information:   [address] [place] [address] [phone]       Newborn Data:   Courtney, Farmer [[id number]]  Live born female  Birth Weight: 2 lb 11.4 oz (1230 g) APGAR: 4, 6   Courtney, Farmer [[id number]]  Live born female  Birth Weight: 3 lb 1.4 oz (1400 g) APGAR: 6, 8  Babies in NICU born at [redacted] wks EGA  Courtney Billard J. 09/06/2014, 8:31 AM

## 2014-09-06 NOTE — Lactation Note (Signed)
This note was copied from the chart of [place]. Lactation Consultation Note  Follow up visit made prior to discharge.  Mom is pumping every 3 hours and obtaining 30-60 mls each pumping.  Pump rental completed.  Instructed to call for concerns/assist prn.  Patient Name: Devynn Hessler [id number] Date: 09/06/2014     Maternal Data    Feeding Feeding Type: Breast Milk Length of feed: 30 min  LATCH Score/Interventions                      Lactation Tools Discussed/Used     Consult Status      Ave Filter 09/06/2014, 10:26 AM

## 2014-10-03 ENCOUNTER — Ambulatory Visit: Payer: Self-pay

## 2014-10-03 NOTE — Lactation Note (Signed)
This note was copied from the chart of [place]. Lactation Consultation Note  Follow up visit done in NICU.  Mom states pumping is going well and supply is good.  She is asking when babies can go to breast.  Babies were just fed so recommended we attempt with latching them tomorrow.  Mom states babies are still having some bradycardic events.  Will follow up to assist with latching babies.  Reminded mom that it will take some time for babies to become effective at transferring an entire feeding.  Practice sessions will introduce them to breast and develop muscles necessary for transfer.  Patient Name: Courtney Farmer [id number] Date: 10/03/2014     Maternal Data    Feeding    LATCH Score/Interventions                      Lactation Tools Discussed/Used     Consult Status      Ave Filter 10/03/2014, 5:09 PM

## 2014-10-05 ENCOUNTER — Ambulatory Visit: Payer: Self-pay

## 2014-10-05 NOTE — Lactation Note (Signed)
This note was copied from the chart of [place]. Lactation Consultation Note  Patient Name: Courtney Farmer [id number] Date: 10/05/2014  NICU baby 43 weeks old, [redacted]w[redacted]d CGA. Assisted mom to latch baby boy "B" in football position to left breast. Mom able to return-demonstrate hand expression with colostrum present. Baby attempted to latch directly to breast, but not able to achieve a latch. Fitted mom with a #20 NS and hand express breast milk into shield. Baby latched deeply with NS and nursed well suckling rhythmically with intermittent swallows noted. Baby tolerated well, and vital signs remained stable throughout breastfeed. Enc mom to continue offering STS and nurse as baby able.   Mom states that she has been pumping every 3-4 hours. Enc mom to sleep well at night, then pump 8 times a day for 15 minutes, roughly every 2 hours. Discussed supply and demand. Enc mom to offer lots of STS and nurse as baby's able.     Maternal Data    Feeding Feeding Type: Breast Milk Length of feed: 30 min  LATCH Score/Interventions                      Lactation Tools Discussed/Used     Consult Status      Inocente Salles 10/05/2014, 3:57 PM

## 2014-10-05 NOTE — Lactation Note (Signed)
This note was copied from the chart of [place]. Lactation Consultation Note  Patient Name: Courtney Farmer [id number] Date: 10/05/2014  NICU baby 22 weeks old, [redacted]w[redacted]d CGA. Baby girl "A" cueing to nurse. Assisted mom to latch baby to left breast in football position. First attempted without NS and baby not interested. After applying #20 NS and pre-filling with EBM, baby girl latched well and began suckling rhythmically in bursts. Baby able to maintain a deep latch and suckle off-and-on with a few swallows noted. Discussed with mother that since this was the babies' first attempt at nursing, we used one breast for both babies. Enc mom to use alternate breasts with the babies as they start nursing more, and to alternate which breast baby nurses from with each feeding. Enc mom to pump after the babies finished nursing, and to continue to pump 8 times a day for 15 minutes, taking a long stretch to sleep at night. Enc mom to call for assistance as needed. Mom given two #20 nipple shields, and mom aware of inpatient and outpatient assistance to nurse babies simultaneously as well.    Maternal Data    Feeding Feeding Type: Breast Milk Length of feed: 45 min (Po x 15/NGx 30)  LATCH Score/Interventions                      Lactation Tools Discussed/Used     Consult Status      Courtney Farmer, Courtney Farmer 10/05/2014, 4:08 PM

## 2014-10-06 ENCOUNTER — Ambulatory Visit: Payer: Self-pay

## 2014-10-06 NOTE — Lactation Note (Signed)
This note was copied from the chart of [place]. Lactation Consultation Note  Patient Name: Courtney Farmer [id number] Date: 10/06/2014    NICU twins 46 weeks old. Mom states that she pumped every 2 hours except while she slept during the night and believes this will help her supply. Discussed with mom that visiting the baby's and having them STS and to the breast also helps with supply. Enc mom to call for assistance as needed.    Maternal Data    Feeding Feeding Type: Breast Milk Nipple Type: Dr. Myra Gianotti Preemie Length of feed: 30 min  LATCH Score/Interventions                      Lactation Tools Discussed/Used     Consult Status      Inocente Salles 10/06/2014, 12:45 PM

## 2018-07-07 ENCOUNTER — Other Ambulatory Visit (HOSPITAL_COMMUNITY)
Admission: RE | Admit: 2018-07-07 | Discharge: 2018-07-07 | Disposition: A | Discharge status: Home / Self Care | Payer: Managed Care, Other (non HMO) | Source: Ambulatory Visit | Attending: Obstetrics and Gynecology | Admitting: Obstetrics and Gynecology

## 2018-07-07 ENCOUNTER — Other Ambulatory Visit: Payer: Self-pay | Admitting: Obstetrics and Gynecology

## 2018-07-07 DIAGNOSIS — Z01419 Encounter for gynecological examination (general) (routine) without abnormal findings: Secondary | ICD-10-CM | POA: Insufficient documentation

## 2018-07-09 LAB — CYTOLOGY - PAP
CHLAMYDIA, DNA PROBE: NEGATIVE
DIAGNOSIS: NEGATIVE
HPV: NOT DETECTED
Neisseria Gonorrhea: NEGATIVE

## 2019-02-12 ENCOUNTER — Encounter (HOSPITAL_COMMUNITY): Payer: Self-pay

## 2019-02-12 ENCOUNTER — Ambulatory Visit (INDEPENDENT_AMBULATORY_CARE_PROVIDER_SITE_OTHER)
Admission: EM | Admit: 2019-02-12 | Discharge: 2019-02-12 | Disposition: A | Discharge status: Home / Self Care | Payer: Managed Care, Other (non HMO) | Source: Home / Self Care

## 2019-02-12 ENCOUNTER — Other Ambulatory Visit: Payer: Self-pay

## 2019-02-12 ENCOUNTER — Emergency Department (HOSPITAL_COMMUNITY)
Admission: EM | Admit: 2019-02-12 | Discharge: 2019-02-12 | Disposition: A | Discharge status: Home / Self Care | Payer: Managed Care, Other (non HMO) | Attending: Emergency Medicine | Admitting: Emergency Medicine

## 2019-02-12 ENCOUNTER — Emergency Department (HOSPITAL_COMMUNITY): Payer: Managed Care, Other (non HMO)

## 2019-02-12 DIAGNOSIS — R079 Chest pain, unspecified: Secondary | ICD-10-CM

## 2019-02-12 DIAGNOSIS — R0789 Other chest pain: Secondary | ICD-10-CM | POA: Diagnosis not present

## 2019-02-12 DIAGNOSIS — R519 Headache, unspecified: Secondary | ICD-10-CM | POA: Insufficient documentation

## 2019-02-12 DIAGNOSIS — R0602 Shortness of breath: Secondary | ICD-10-CM

## 2019-02-12 LAB — CBC
HCT: 40.9 % (ref 36.0–46.0)
Hemoglobin: 13.7 g/dL (ref 12.0–15.0)
MCH: 30.3 pg (ref 26.0–34.0)
MCHC: 33.5 g/dL (ref 30.0–36.0)
MCV: 90.5 fL (ref 80.0–100.0)
Platelets: 307 10*3/uL (ref 150–400)
RBC: 4.52 MIL/uL (ref 3.87–5.11)
RDW: 14.7 % (ref 11.5–15.5)
WBC: 6.5 10*3/uL (ref 4.0–10.5)
nRBC: 0 % (ref 0.0–0.2)

## 2019-02-12 LAB — BASIC METABOLIC PANEL
Anion gap: 13 (ref 5–15)
BUN: 9 mg/dL (ref 6–20)
CO2: 21 mmol/L — ABNORMAL LOW (ref 22–32)
Calcium: 9.3 mg/dL (ref 8.9–10.3)
Chloride: 107 mmol/L (ref 98–111)
Creatinine, Ser: 0.8 mg/dL (ref 0.44–1.00)
GFR calc Af Amer: >60 mL/min (ref >60)
GFR calc non Af Amer: >60 mL/min (ref >60)
Glucose, Bld: 117 mg/dL — ABNORMAL HIGH (ref 70–99)
Potassium: 4.5 mmol/L (ref 3.5–5.1)
Sodium: 141 mmol/L (ref 135–145)

## 2019-02-12 LAB — TROPONIN I (HIGH SENSITIVITY)
Troponin I (High Sensitivity): 2 ng/L (ref <18)
Troponin I (High Sensitivity): 3 ng/L (ref <18)

## 2019-02-12 LAB — I-STAT BETA HCG BLOOD, ED (MC, WL, AP ONLY): I-stat hCG, quantitative: <5 m[IU]/mL (ref <5)

## 2019-02-12 MED ORDER — SODIUM CHLORIDE 0.9% FLUSH: 3.0000 mL | Freq: Once | INTRAVENOUS | Status: DC

## 2019-02-12 MED ORDER — ASPIRIN 81 MG PO CHEW
324.0000 mg | CHEWABLE_TABLET | Freq: Once | ORAL | Status: AC
  Administered 2019-02-12: 324 mg via ORAL
  Filled 2019-02-12: qty 4

## 2019-02-12 NOTE — ED Provider Notes (Signed)
[facility] EMERGENCY DEPARTMENT Provider Note   CSN: [id number] Arrival date & time: 02/12/19  1245     History   Chief Complaint Chief Complaint  Patient presents with  . Chest Pain  . Shortness of Breath    HPI Courtney Farmer is a 38 y.o. female.     HPI   Pt is a 38 y/o female with a h/o HLD who presents to the ED today c/o chest pain. States that she was sitting down and experienced a headache. She took motrin and then developed tightness in her chest. Pain located to the left side of the chest. She also had some tightness in her neck. Pain rated 4/10. Pain has improved since onset. Sxs started about 30 minutes PTA.  She reports associated SOB, shaking, and lightheadedness. Denies associated nausea or pain with inspiration.   Denies h/o HTD, DM, or tobacco use. Denies early family history of heart disease. Denies leg pain/swelling, hemoptysis, recent surgery/trauma, recent long travel, hormone use, personal hx of cancer, or hx of DVT/PE.    Past Medical History:  Diagnosis Date  . Ectopic pregnancy     Patient Active Problem List   Diagnosis Date Noted  . Preterm premature rupture of membranes (PPROM) delivered, current hospitalization 08/09/2014  . [redacted] weeks gestation of pregnancy   . AMA (advanced maternal age) multigravida 71+   . Pregnancy resulting from assisted reproductive technology   . Decreased amniotic fluid   . [redacted] weeks gestation of pregnancy   . Dichorionic diamniotic twin pregnancy in second trimester   . Encounter for fetal anatomic survey     Past Surgical History:  Procedure Laterality Date  . CESAREAN SECTION N/A 09/03/2014   Procedure: CESAREAN SECTION;  Surgeon: Crawford Givens, MD;  Location: [facility];  Service: Obstetrics;  Laterality: N/A;  . NO PAST SURGERIES       OB History    Gravida  3   Para  1   Term      Preterm  1   AB  2   Living  0     SAB  1   TAB      Ectopic  1   Multiple  1   Live Births                Home Medications    Prior to Admission medications   Medication Sig Start Date End Date Taking? Authorizing Provider  ibuprofen (ADVIL,MOTRIN) 600 MG tablet Take 1 tablet (600 mg total) by mouth every 6 (six) hours as needed. Patient not taking: Reported on 02/12/2019 09/06/14   Christophe Louis, MD  ondansetron (ZOFRAN) 4 MG tablet Take 1 tablet (4 mg total) by mouth every 6 (six) hours. Patient not taking: Reported on 08/09/2014 05/28/14   Luvenia Redden, PA-C  promethazine (PHENERGAN) 12.5 MG tablet Take 1 tablet (12.5 mg total) by mouth every 6 (six) hours as needed for nausea or vomiting. Patient not taking: Reported on 08/09/2014 05/28/14   Luvenia Redden, PA-C    Family History History reviewed. No pertinent family history.  Social History Social History   Tobacco Use  . Smoking status: Never Smoker  Substance Use Topics  . Alcohol use: No  . Drug use: No     Allergies   Patient has no known allergies.   Review of Systems Review of Systems  Constitutional: Negative for fever.  HENT: Negative for ear pain and sore throat.   Eyes: Negative for visual  disturbance.  Respiratory: Positive for shortness of breath. Negative for cough.   Cardiovascular: Positive for chest pain.  Gastrointestinal: Negative for abdominal pain, constipation, diarrhea, nausea and vomiting.  Genitourinary: Negative for dysuria and hematuria.  Musculoskeletal: Negative for arthralgias and back pain.  Skin: Negative for color change and rash.  Neurological: Positive for light-headedness and headaches (resolved).  All other systems reviewed and are negative.    Physical Exam Updated Vital Signs BP (!) 142/94   Pulse 69   Temp 98.4 F (36.9 C) (Oral)   Resp 13   Ht 5\' 7"  (1.702 m)   Wt 104 kg   SpO2 100%   BMI 35.91 kg/m   Physical Exam Vitals signs and nursing note reviewed.  Constitutional:      General: She is not in acute distress.    Appearance: She is  well-developed.  HENT:     Head: Normocephalic and atraumatic.  Eyes:     Conjunctiva/sclera: Conjunctivae normal.  Neck:     Musculoskeletal: Neck supple.  Cardiovascular:     Rate and Rhythm: Normal rate and regular rhythm.     Heart sounds: Normal heart sounds. No murmur.  Pulmonary:     Effort: Pulmonary effort is normal. No respiratory distress.     Breath sounds: Normal breath sounds. No decreased breath sounds, wheezing, rhonchi or rales.  Chest:     Chest wall: No tenderness.  Abdominal:     Palpations: Abdomen is soft.     Tenderness: There is no abdominal tenderness.  Musculoskeletal:     Right lower leg: She exhibits no tenderness. No edema.     Left lower leg: She exhibits no tenderness. No edema.  Skin:    General: Skin is warm and dry.  Neurological:     Mental Status: She is alert.     ED Treatments / Results  Labs (all labs ordered are listed, but only abnormal results are displayed) Labs Reviewed  BASIC METABOLIC PANEL - Abnormal; Notable for the following components:      Result Value   CO2 21 (*)    Glucose, Bld 117 (*)    All other components within normal limits  CBC  I-STAT BETA HCG BLOOD, ED ([facility], [facility], [facility] ONLY)  TROPONIN I (HIGH SENSITIVITY)  TROPONIN I (HIGH SENSITIVITY)    EKG None  Radiology Dg Chest 2 View  Result Date: 02/12/2019 CLINICAL DATA:  Chest pain. EXAM: CHEST - 2 VIEW COMPARISON:  None. FINDINGS: Cardiomediastinal silhouette is normal. Mediastinal contours appear intact. There is no evidence of focal airspace consolidation, pleural effusion or pneumothorax. Osseous structures are without acute abnormality. Soft tissues are grossly normal. IMPRESSION: No active cardiopulmonary disease. Electronically Signed   By: Fidela Salisbury M.D.   On: 02/12/2019 15:27    Procedures Procedures (including critical care time)  Medications Ordered in ED Medications  sodium chloride flush (NS) 0.9 % injection 3 mL (0 mLs Intravenous Hold  02/12/19 1350)  aspirin chewable tablet 324 mg (324 mg Oral Given 02/12/19 1425)     Initial Impression / Assessment and Plan / ED Course  I have reviewed the triage vital signs and the nursing notes.  Pertinent labs & imaging results that were available during my care of the patient were reviewed by me and considered in my medical decision making (see chart for details).      Final Clinical Impressions(s) / ED Diagnoses   Final diagnoses:  Atypical chest pain   Pt is a 38 y/o  female with a h/o HLD who presents to the ED today c/o chest pain. States that she was sitting down and experienced a headache. She took motrin and then developed tightness in her chest. Pain located to the left side of the chest. She also had some tightness in her neck. Pain rated 4/10. Pain has improved since onset. Sxs started about 30 minutes PTA.  She reports associated SOB, shaking, and lightheadedness. Denies associated nausea or pain with inspiration.   On arrival mildly hypertensive, otherwise vs reassuring. Exam is benign.  CBC WNL BMP WNL Trop is initially negative, delta trop pending at shift change.   Low risk for ACS, HEART score 2 (based on history and risk factors) Negative for PE risk factors, PERC negative. Low risk [place].  Doubt dissection, esophageal perforation  At shift change, care transitioned to [facility], PA-C with plan to f/u on pending delta trop. If negative, expect that patient is appropriate for discharge with close f/u with cardiology and strict return precautions.     ED Discharge Orders    None       Rodney Booze, PA-C 02/12/19 [place], [place], DO 02/12/19 1537

## 2019-02-12 NOTE — Discharge Instructions (Addendum)
Your work-up today was reassuring.  Your heart enzymes were normal and so was your EKG.  It will be important for you to follow-up with a cardiologist.  You were given information to do so.  Please call the office schedule an appointment within the next 3 to 7 days.  Return to the emergency department for any new or worsening symptoms including persistent chest pain, chest pain with exertion, shortness of breath.

## 2019-02-12 NOTE — ED Notes (Signed)
Patient Alert and oriented to baseline. Stable and ambulatory to baseline. Patient verbalized understanding of the discharge instructions.  Patient belongings were taken by the patient.   

## 2019-02-12 NOTE — ED Triage Notes (Signed)
Patient is being discharged from the Urgent [facility] and sent to the Emergency Department via POV driven by husband. Per PA, patient is stable but in need of higher level of care due to sudden onset of CP and SOB. Patient is aware and verbalizes understanding of plan of care.VSS, EKG obtained prior to pt's departure.

## 2019-02-12 NOTE — ED Provider Notes (Signed)
Physical Exam  BP (!) 138/99   Pulse 68   Temp 98.4 F (36.9 C) (Oral)   Resp 13   Ht 5\' 7"  (1.702 m)   Wt 104 kg   SpO2 100%   BMI 35.91 kg/m   Physical Exam   Gen: Appears nontoxic  ED Course/Procedures     Procedures  MDM   Patient signed out to me by C Couture, PA-C.  Please see previous notes for further history.  In brief, patient presenting for evaluation of chest pain and shortness of breath.  This began today.  No risk factors for ACS.  PERC negative, low suspicion for PE.  Initial work-up reassuring, initial troponin negative.  However, delta troponin pending.  Chest x-ray pending.  Chest x-ray viewed interpreted by me, no pneumonia pneumothorax, effusion, cardiomegaly.  Troponin negative.  On reassessment, patient reports pain resolved with aspirin.  Discussed symptomatic treatment and follow-up with cards.  Offered COVID testing as patient also had a headache, although suspicion is low. Patient declined.  At this time, patient appears safe for discharge.  Return precautions given.  Patient states she understands and agrees to plan.  Courtney Farmer was evaluated in Emergency Department on 02/12/2019 for the symptoms described in the history of present illness. She was evaluated in the context of the global COVID-19 pandemic, which necessitated consideration that the patient might be at risk for infection with the SARS-CoV-2 virus that causes COVID-19. Institutional protocols and algorithms that pertain to the evaluation of patients at risk for COVID-19 are in a state of rapid change based on information released by regulatory bodies including the CDC and federal and state organizations. These policies and algorithms were followed during the patient's care in the ED.   Results for orders placed or performed during the hospital encounter of 123456  Basic metabolic panel  Result Value Ref Range   Sodium 141 135 - 145 mmol/L   Potassium 4.5 3.5 - 5.1 mmol/L   Chloride 107 98 - 111 mmol/L   CO2 21 (L) 22 - 32 mmol/L   Glucose, Bld 117 (H) 70 - 99 mg/dL   BUN 9 6 - 20 mg/dL   Creatinine, Ser 0.80 0.44 - 1.00 mg/dL   Calcium 9.3 8.9 - 10.3 mg/dL   GFR calc non Af Amer >60 >60 mL/min   GFR calc Af Amer >60 >60 mL/min   Anion gap 13 5 - 15  CBC  Result Value Ref Range   WBC 6.5 4.0 - 10.5 K/uL   RBC 4.52 3.87 - 5.11 MIL/uL   Hemoglobin 13.7 12.0 - 15.0 g/dL   HCT 40.9 36.0 - 46.0 %   MCV 90.5 80.0 - 100.0 fL   MCH 30.3 26.0 - 34.0 pg   MCHC 33.5 30.0 - 36.0 g/dL   RDW 14.7 11.5 - 15.5 %   Platelets 307 150 - 400 K/uL   nRBC 0.0 0.0 - 0.2 %  I-Stat beta hCG blood, ED  Result Value Ref Range   I-stat hCG, quantitative <5.0 <5 mIU/mL   Comment 3          Troponin I (High Sensitivity)  Result Value Ref Range   Troponin I (High Sensitivity) 2 <18 ng/L  Troponin I (High Sensitivity)  Result Value Ref Range   Troponin I (High Sensitivity) 3 <18 ng/L   Dg Chest 2 View  Result Date: 02/12/2019 CLINICAL DATA:  Chest pain. EXAM: CHEST - 2 VIEW COMPARISON:  None. FINDINGS: Cardiomediastinal silhouette is normal.  Mediastinal contours appear intact. There is no evidence of focal airspace consolidation, pleural effusion or pneumothorax. Osseous structures are without acute abnormality. Soft tissues are grossly normal. IMPRESSION: No active cardiopulmonary disease. Electronically Signed   By: Fidela Salisbury M.D.   On: 02/12/2019 15:27          Franchot Heidelberg, PA-C 02/12/19 1710    Sherwood Gambler, MD 02/13/19 1707

## 2019-02-12 NOTE — ED Triage Notes (Signed)
Pt arrives from urgent care with CP and SOB, starting about 20 minutes ago, took motrin but no ASA or nitro. Central chest pain non-radiating, SOB and chest tightness started at the same time. Reports dizziness but no nausea, no cardiac history.

## 2019-02-28 ENCOUNTER — Ambulatory Visit: Payer: Managed Care, Other (non HMO) | Admitting: Cardiovascular Disease

## 2019-02-28 NOTE — Progress Notes (Deleted)
Cardiology Office Note:   Date:  02/28/2019  NAME:  Courtney Farmer    MRN: [id number] DOB:  [birth year]   PCP:  Patient, No Pcp Per  Cardiologist:  No primary care provider on file.  Electrophysiologist:  None   Referring MD: No ref. provider found   No chief complaint on file. ***  History of Present Illness:   Courtney Farmer is a 38 y.o. female with a hx of hyperlipidemia who is being seen today for the evaluation of chest pain.  She was evaluated in the emergency room on 10/10 for atypical chest pain.  EKG was normal.  Troponins, high-sensitivity, were negative x2.  Past Medical History: Past Medical History:  Diagnosis Date  . Ectopic pregnancy     Past Surgical History: Past Surgical History:  Procedure Laterality Date  . CESAREAN SECTION N/A 09/03/2014   Procedure: CESAREAN SECTION;  Surgeon: Crawford Givens, MD;  Location: [facility];  Service: Obstetrics;  Laterality: N/A;  . NO PAST SURGERIES      Current Medications: No outpatient medications have been marked as taking for the 02/28/19 encounter (Appointment) with O'Neal, Cassie Freer, MD.     Allergies:    Patient has no known allergies.   Social History: Social History   Socioeconomic History  . Marital status: Married    Spouse name: Not on file  . Number of children: Not on file  . Years of education: Not on file  . Highest education level: Not on file  Occupational History  . Not on file  Social Needs  . Financial resource strain: Not on file  . Food insecurity    Worry: Not on file    Inability: Not on file  . Transportation needs    Medical: Not on file    Non-medical: Not on file  Tobacco Use  . Smoking status: Never Smoker  Substance and Sexual Activity  . Alcohol use: No  . Drug use: No  . Sexual activity: Yes  Lifestyle  . Physical activity    Days per week: Not on file    Minutes per session: Not on file  . Stress: Not on file  Relationships  . Social Herbalist on  phone: Not on file    Gets together: Not on file    Attends religious service: Not on file    Active member of club or organization: Not on file    Attends meetings of clubs or organizations: Not on file    Relationship status: Not on file  Other Topics Concern  . Not on file  Social History Narrative  . Not on file     Family History: The patient's ***family history is not on file.  ROS:   All other ROS reviewed and negative. Pertinent positives noted in the HPI.     EKGs/Labs/Other Studies Reviewed:   The following studies were personally reviewed by me today:  EKG:  EKG is *** ordered today.  The ekg ordered today demonstrates ***, and was personally reviewed by me.   Recent Labs: 02/12/2019: BUN 9; Creatinine, Ser 0.80; Hemoglobin 13.7; Platelets 307; Potassium 4.5; Sodium 141   Recent Lipid Panel No results found for: CHOL, TRIG, HDL, CHOLHDL, VLDL, LDLCALC, LDLDIRECT  Physical Exam:   VS:  There were no vitals taken for this visit.   Wt Readings from Last 3 Encounters:  02/12/19 229 lb 4.5 oz (104 kg)  08/30/14 228 lb 14.4 oz (103.8 kg)  08/09/14 230 lb 8  oz (104.6 kg)    General: Well nourished, well developed, in no acute distress Heart: Atraumatic, normal size  Eyes: PEERLA, EOMI  Neck: Supple, no JVD Endocrine: No thryomegaly Cardiac: Normal S1, S2; RRR; no murmurs, rubs, or gallops Lungs: Clear to auscultation bilaterally, no wheezing, rhonchi or rales  Abd: Soft, nontender, no hepatomegaly  Ext: No edema, pulses 2+ Musculoskeletal: No deformities, BUE and BLE strength normal and equal Skin: Warm and dry, no rashes   Neuro: Alert and oriented to person, place, time, and situation, CNII-XII grossly intact, no focal deficits  Psych: Normal mood and affect   ASSESSMENT:   Oktober Glueckert is a 38 y.o. female who presents for the following: No diagnosis found.  PLAN:   There are no diagnoses linked to this encounter.  Disposition: No follow-ups on file.   Medication Adjustments/Labs and Tests Ordered: Current medicines are reviewed at length with the patient today.  Concerns regarding medicines are outlined above.  No orders of the defined types were placed in this encounter.  No orders of the defined types were placed in this encounter.   There are no Patient Instructions on file for this visit.   Signed, Addison Naegeli. Audie Box, [place]  [address] [place], [place] [address] [phone]  02/28/2019 6:53 AM

## 2019-02-28 NOTE — Progress Notes (Signed)
Cardiology Office Note:   Date:  03/01/2019  NAME:  Courtney Farmer    MRN: [id number] DOB:  11-16-1980   PCP:  Leeroy Cha, MD  Cardiologist:  No primary care provider on file.   Referring MD: No ref. provider found   Chief Complaint  Patient presents with  . Chest Pain   History of Present Illness:   Courtney Farmer is a 38 y.o. female with a hx of ectopic pregnancy who is being seen today for the evaluation of chest pain.  She was seen in the emergency room on 10/12 for acute onset shortness of breath that was associated with chest tightness.  She reports she is never experienced this before.  Her symptoms lasted 20 minutes and improved when she was in the emergency room.  While in the emergency room her EKG was normal without ischemic changes and her high-sensitivity troponins were negative x2.  She was given a breathing treatment with improvement in symptoms.  She says since that she has had around 4 episodes over the past 2 weeks.  She reports the symptoms have been less severe.  They appear to start with shortness of breath and then she will get chest tightness associated with deep breathing.  She is reported that the emergency room has told her this is likely panic attack.  I questioned her about recent allergens, and she does report that her fridge is leaking in the house, and there may be mold.  She reports no new medications and she takes no medications.  She is not been exposed any recent allergens that I can tell.  No new changes to detergents or any like that.  She has no family history of coronary artery disease.  She is fairly active and has no complaints of any chest pain or chest tightness with exercise.  Review of laboratory data shows an A1c of 5.9, and LDL of 133.  She has been instructed to lose a bit of weight by her primary care physician and she is working on this.  She has not seen a pulmonologist.   Past Medical History: Past Medical History:  Diagnosis  Date  . Ectopic pregnancy     Past Surgical History: Past Surgical History:  Procedure Laterality Date  . CESAREAN SECTION N/A 09/03/2014   Procedure: CESAREAN SECTION;  Surgeon: Crawford Givens, MD;  Location: [facility];  Service: Obstetrics;  Laterality: N/A;  . NO PAST SURGERIES      Current Medications: No outpatient medications have been marked as taking for the 03/01/19 encounter (Office Visit) with Geralynn Rile, MD.     Allergies:    Patient has no known allergies.   Social History: Social History   Socioeconomic History  . Marital status: Married    Spouse name: Not on file  . Number of children: Not on file  . Years of education: Not on file  . Highest education level: Not on file  Occupational History  . Not on file  Social Needs  . Financial resource strain: Not on file  . Food insecurity    Worry: Not on file    Inability: Not on file  . Transportation needs    Medical: Not on file    Non-medical: Not on file  Tobacco Use  . Smoking status: Never Smoker  . Smokeless tobacco: Never Used  Substance and Sexual Activity  . Alcohol use: Yes  . Drug use: No  . Sexual activity: Yes  Lifestyle  . Physical activity  Days per week: Not on file    Minutes per session: Not on file  . Stress: Not on file  Relationships  . Social Herbalist on phone: Not on file    Gets together: Not on file    Attends religious service: Not on file    Active member of club or organization: Not on file    Attends meetings of clubs or organizations: Not on file    Relationship status: Not on file  Other Topics Concern  . Not on file  Social History Narrative  . Not on file     Family History: The patient's family history includes Hypertension in her mother.  ROS:   All other ROS reviewed and negative. Pertinent positives noted in the HPI.     EKGs/Labs/Other Studies Reviewed:   The following studies were personally reviewed by me today:  EKG: EKG on  10/12 demonstrates normal sinus rhythm heart rate 73, no acute ischemic changes, normal EKG, and was personally reviewed by me.   Recent Labs: 02/12/2019: BUN 9; Creatinine, Ser 0.80; Hemoglobin 13.7; Platelets 307; Potassium 4.5; Sodium 141   Recent Lipid Panel No results found for: CHOL, TRIG, HDL, CHOLHDL, VLDL, LDLCALC, LDLDIRECT  Physical Exam:   VS:  BP 124/81   Pulse 83   Ht 5\' 7"  (1.702 m)   Wt 230 lb (104.3 kg)   SpO2 100%   BMI 36.02 kg/m    Wt Readings from Last 3 Encounters:  03/01/19 230 lb (104.3 kg)  02/12/19 229 lb 4.5 oz (104 kg)  08/30/14 228 lb 14.4 oz (103.8 kg)    General: Well nourished, well developed, in no acute distress Heart: Atraumatic, normal size  Eyes: PEERLA, EOMI  Neck: Supple, no JVD Endocrine: No thryomegaly Cardiac: Normal S1, S2; RRR; no murmurs, rubs, or gallops Lungs: Clear to auscultation bilaterally, no wheezing, rhonchi or rales  Abd: Soft, nontender, no hepatomegaly  Ext: No edema, pulses 2+ Musculoskeletal: No deformities, BUE and BLE strength normal and equal Skin: Warm and dry, no rashes   Neuro: Alert and oriented to person, place, time, and situation, CNII-XII grossly intact, no focal deficits  Psych: Normal mood and affect   ASSESSMENT:   Courtney Farmer is a 38 y.o. female who presents for the following: 1. SOB (shortness of breath)   2. Chest pain of uncertain etiology     PLAN:   1. SOB (shortness of breath) 2. Chest pain of uncertain etiology -She presents with a 2-week history of shortness of breath and chest tightness.  Her symptoms did improve in the emergency room with a breathing treatment.  She has a very normal EKG without any suggestion of ischemic changes or prior infarction.  She also has had negative high-sensitivity troponins x2 when she was in the emergency room.  It appears that the shortness of breath precedes the chest tightness.  Symptoms are improved with deep breathing exercises.  Overall, this is  very atypical and likely noncardiac chest pain.  I do question if she has asthma.  She reports that there may be a leaking fridge in the house, and she will investigate this to determine there is mold in the house.  We will prescribe her an albuterol inhaler, and refer her to pulmonology.  I wonder if there is an allergy component here.  We will see her back on an as-needed basis and if pulmonology think she needs a cardiac work-up we will do so.  However, at this  point her symptoms are fairly classic for asthma.   Disposition: Return if symptoms worsen or fail to improve.  Medication Adjustments/Labs and Tests Ordered: Current medicines are reviewed at length with the patient today.  Concerns regarding medicines are outlined above.  No orders of the defined types were placed in this encounter.  No orders of the defined types were placed in this encounter.   There are no Patient Instructions on file for this visit.   Signed, Addison Naegeli. Audie Box, [place]  [address], [place], [place] [address] [phone]  03/01/2019 4:43 PM

## 2019-03-01 ENCOUNTER — Other Ambulatory Visit: Payer: Self-pay

## 2019-03-01 ENCOUNTER — Ambulatory Visit: Payer: Managed Care, Other (non HMO) | Admitting: Cardiovascular Disease

## 2019-03-01 ENCOUNTER — Encounter: Payer: Self-pay | Admitting: Cardiovascular Disease

## 2019-03-01 VITALS — BP 124/81 | HR 83 | Ht 67.0 in | Wt 230.0 lb

## 2019-03-01 DIAGNOSIS — R079 Chest pain, unspecified: Secondary | ICD-10-CM

## 2019-03-01 DIAGNOSIS — R0602 Shortness of breath: Secondary | ICD-10-CM | POA: Diagnosis not present

## 2019-03-01 MED ORDER — ALBUTEROL SULFATE HFA 108 (90 BASE) MCG/ACT IN AERS: 2.0000 | INHALATION_SPRAY | Freq: Four times a day (QID) | RESPIRATORY_TRACT | 1 refills | Status: DC | PRN

## 2019-03-01 NOTE — Patient Instructions (Addendum)
Medication Instructions:  ALBUTEROL  INHALER  USED AS NEEDED FOR ASTHMA  *If you need a refill on your cardiac medications before your next appointment, please call your pharmacy*  Lab Work: NOT NEEDED  Testing/Procedures: NOT NEEDED  Follow-Up: At [facility], you and your health needs are our priority.  As part of our continuing mission to provide you with exceptional heart care, we have created designated Provider Care Teams.  These Care Teams include your primary Cardiologist (physician) and Advanced Practice Providers (APPs -  Physician Assistants and Nurse Practitioners) who all work together to provide you with the care you need, when you need it.  Your next appointment:   PRN  The format for your next appointment:   AS NEEDED  Provider:   Eleonore Chiquito, MD  Other Instructions

## 2019-03-02 ENCOUNTER — Telehealth: Payer: Self-pay | Admitting: *Deleted

## 2019-03-02 NOTE — Telephone Encounter (Signed)
Spoke with patient regarding appointment with [facility]---scheduled  Monday  03/14/19 at 3:30 pm with Dr. Alvina Filbert.  Patient was given address--[address] and phone # [phone].

## 2019-03-14 ENCOUNTER — Encounter: Payer: Self-pay | Admitting: Pulmonary Disease

## 2019-03-14 ENCOUNTER — Ambulatory Visit: Payer: Managed Care, Other (non HMO) | Admitting: Pulmonary Disease

## 2019-03-14 ENCOUNTER — Other Ambulatory Visit: Payer: Self-pay

## 2019-03-14 VITALS — BP 126/62 | HR 68 | Ht 67.0 in | Wt 231.2 lb

## 2019-03-14 DIAGNOSIS — R0602 Shortness of breath: Secondary | ICD-10-CM

## 2019-03-14 NOTE — Progress Notes (Signed)
Subjective:    Patient ID: Courtney Farmer, female    DOB: [birth year], 38 y.o.   MRN: [id number]  Patient being seen for recent episode of chest tightness, shortness of breath  Symptoms started at rest Chest tightness, shortness of breath, lightheadedness No previous history of similar symptoms  No previous history of lung disease No history of asthma  No history of allergies  No family history or personal history of heart disease  No history of hypertension  Never smoker  No recent changes to her work environment or home environment No new exposures to cleaning agents, dust, smoke  Since the visit to the emergency department has not had any recurrence of her symptoms  She was given albuterol which she has not had to use   Past Medical History:  Diagnosis Date  . Ectopic pregnancy     Social History   Socioeconomic History  . Marital status: Married    Spouse name: Not on file  . Number of children: Not on file  . Years of education: Not on file  . Highest education level: Not on file  Occupational History  . Not on file  Social Needs  . Financial resource strain: Not on file  . Food insecurity    Worry: Not on file    Inability: Not on file  . Transportation needs    Medical: Not on file    Non-medical: Not on file  Tobacco Use  . Smoking status: Never Smoker  . Smokeless tobacco: Never Used  Substance and Sexual Activity  . Alcohol use: Yes  . Drug use: No  . Sexual activity: Yes  Lifestyle  . Physical activity    Days per week: Not on file    Minutes per session: Not on file  . Stress: Not on file  Relationships  . Social Herbalist on phone: Not on file    Gets together: Not on file    Attends religious service: Not on file    Active member of club or organization: Not on file    Attends meetings of clubs or organizations: Not on file    Relationship status: Not on file  . Intimate partner violence    Fear of current or ex  partner: Not on file    Emotionally abused: Not on file    Physically abused: Not on file    Forced sexual activity: Not on file  Other Topics Concern  . Not on file  Social History Narrative  . Not on file   Family History  Problem Relation Age of Onset  . Hypertension Mother    Review of Systems  Constitutional: Negative for fever and unexpected weight change.  HENT: Negative for congestion, dental problem, ear pain, nosebleeds, postnasal drip, rhinorrhea, sinus pressure, sneezing, sore throat and trouble swallowing.   Eyes: Negative for redness and itching.  Respiratory: Positive for shortness of breath. Negative for cough, chest tightness and wheezing.   Cardiovascular: Negative for palpitations and leg swelling.  Gastrointestinal: Negative for nausea and vomiting.  Genitourinary: Negative for dysuria.  Musculoskeletal: Negative for joint swelling.  Skin: Negative for rash.  Allergic/Immunologic: Negative.  Negative for environmental allergies, food allergies and immunocompromised state.  Neurological: Negative for headaches.  Hematological: Does not bruise/bleed easily.  Psychiatric/Behavioral: Negative for dysphoric mood. The patient is not nervous/anxious.       Objective:   Physical Exam Constitutional:      Appearance: Normal appearance.  HENT:  Head: Normocephalic and atraumatic.  Eyes:     Pupils: Pupils are equal, round, and reactive to light.  Neck:     Musculoskeletal: Normal range of motion and neck supple. No neck rigidity.  Cardiovascular:     Rate and Rhythm: Normal rate and regular rhythm.     Pulses: Normal pulses.     Heart sounds: Normal heart sounds. No murmur. No friction rub.  Pulmonary:     Effort: Pulmonary effort is normal. No respiratory distress.     Breath sounds: Normal breath sounds. No stridor. No wheezing or rhonchi.  Musculoskeletal: Normal range of motion.        General: No swelling.  Skin:    General: Skin is warm.   Neurological:     General: No focal deficit present.     Mental Status: She is alert.  Psychiatric:        Mood and Affect: Mood normal.    Vitals:   03/14/19 1551  BP: 126/62  Pulse: 68  SpO2: 99%      Recent chest x-ray in the emergency department-no infiltrative process  Assessment & Plan:  .  Shortness of breath -No previous history of asthma -History not consistent with asthma, she did have some improvement with use of bronchodilator in the emergency department -Has not had any wheezing -No exposure history -No history of recent changes to environment -One-time event -No history of allergies  Panic attack is a possibility -No history of panic attacks .-Usually not an anxious person -No recurrence of symptoms  At present I do not believe any further evaluation is needed -I will be glad to see her as needed -A PFT was discussed as a possibility if recurrence of symptoms -She does have albuterol which she can use as needed  I will see as needed

## 2019-07-02 ENCOUNTER — Ambulatory Visit: Payer: Managed Care, Other (non HMO) | Attending: Internal Medicine

## 2019-07-02 DIAGNOSIS — Z23 Encounter for immunization: Secondary | ICD-10-CM | POA: Insufficient documentation

## 2019-07-02 NOTE — Progress Notes (Signed)
   Covid-19 Vaccination Clinic  Name:  Kathryn Schallert    MRN: [id number] DOB: [birth year]  07/02/2019  Ms. Ensz was observed post Covid-19 immunization for 15 minutes without incidence. She was provided with Vaccine Information Sheet and instruction to access the V-Safe system.   Ms. Gorelik was instructed to call 911 with any severe reactions post vaccine: Marland Kitchen Difficulty breathing  . Swelling of your face and throat  . A fast heartbeat  . A bad rash all over your body  . Dizziness and weakness    Immunizations Administered    Name Date Dose VIS Date Route   Pfizer COVID-19 Vaccine 07/02/2019  9:57 AM 0.3 mL 04/15/2019 Intramuscular   Manufacturer: [place]   Lot: [id number]   [place]: [id number]

## 2019-07-23 ENCOUNTER — Ambulatory Visit: Payer: Managed Care, Other (non HMO) | Attending: Internal Medicine

## 2019-07-23 DIAGNOSIS — Z23 Encounter for immunization: Secondary | ICD-10-CM

## 2019-07-23 NOTE — Progress Notes (Signed)
   Covid-19 Vaccination Clinic  Name:  Courtney Farmer    MRN: [id number] DOB: 04-27-1981  07/23/2019  [name] was observed post Covid-19 immunization for 15 minutes without incident. She was provided with Vaccine Information Sheet and instruction to access the V-Safe system.   [name] was instructed to call 911 with any severe reactions post vaccine: Marland Kitchen Difficulty breathing  . Swelling of face and throat  . A fast heartbeat  . A bad rash all over body  . Dizziness and weakness   Immunizations Administered    Name Date Dose VIS Date Route   Pfizer COVID-19 Vaccine 07/23/2019 11:37 AM 0.3 mL 04/15/2019 Intramuscular   Manufacturer: [place]   Lot: [id number]   [place]: [id number]

## 2019-07-27 ENCOUNTER — Ambulatory Visit: Payer: Managed Care, Other (non HMO)

## 2020-03-24 ENCOUNTER — Other Ambulatory Visit: Payer: Self-pay

## 2020-03-24 ENCOUNTER — Ambulatory Visit
Admission: EM | Admit: 2020-03-24 | Discharge: 2020-03-24 | Disposition: A | Discharge status: Home / Self Care | Payer: Managed Care, Other (non HMO) | Attending: Family Medicine | Admitting: Family Medicine

## 2020-03-24 DIAGNOSIS — H1032 Unspecified acute conjunctivitis, left eye: Secondary | ICD-10-CM

## 2020-03-24 MED ORDER — PREDNISOLONE ACETATE 1 % OP SUSP: 1.0000 [drp] | Freq: Two times a day (BID) | OPHTHALMIC | 0 refills | Status: DC

## 2020-03-24 NOTE — ED Triage Notes (Addendum)
Pt is here with left eye pain/irritation that started this morning after sleeping in her contacts, pt has used sterile eye drops to relieve discomfort.

## 2020-03-24 NOTE — ED Provider Notes (Signed)
[facility]    CSN: [id number] Arrival date & time: 03/24/20  1038      History   Chief Complaint Chief Complaint  Patient presents with  . Eye Pain    HPI Courtney Farmer is a 39 y.o. female.   Initial [facility] patient   Pt is here with left eye pain/irritation that started this morning after sleeping in her contacts, pt has used sterile eye drops to relieve discomfort.        Past Medical History:  Diagnosis Date  . Ectopic pregnancy     Patient Active Problem List   Diagnosis Date Noted  . Preterm premature rupture of membranes (PPROM) delivered, current hospitalization 08/09/2014  . [redacted] weeks gestation of pregnancy   . AMA (advanced maternal age) multigravida 2+   . Pregnancy resulting from assisted reproductive technology   . Decreased amniotic fluid   . [redacted] weeks gestation of pregnancy   . Dichorionic diamniotic twin pregnancy in second trimester   . Encounter for fetal anatomic survey     Past Surgical History:  Procedure Laterality Date  . CESAREAN SECTION N/A 09/03/2014   Procedure: CESAREAN SECTION;  Surgeon: Crawford Givens, MD;  Location: [facility];  Service: Obstetrics;  Laterality: N/A;  . NO PAST SURGERIES      OB History    Gravida  3   Para  1   Term      Preterm  1   AB  2   Living  0     SAB  1   TAB      Ectopic  1   Multiple  1   Live Births               Home Medications    Prior to Admission medications   Medication Sig Start Date End Date Taking? Authorizing Provider  albuterol (VENTOLIN HFA) 108 (90 Base) MCG/ACT inhaler Inhale 2 puffs into the lungs every 6 (six) hours as needed for wheezing or shortness of breath. 03/01/19   O'NealCassie Freer, MD  prednisoLONE acetate (PRED FORTE) 1 % ophthalmic suspension Place 1 drop into the left eye in the morning and at bedtime. 03/24/20   Robyn Haber, MD    Family History Family History  Problem Relation Age of Onset  . Hypertension Mother   .  Healthy Father     Social History Social History   Tobacco Use  . Smoking status: Never Smoker  . Smokeless tobacco: Never Used  Substance Use Topics  . Alcohol use: Yes  . Drug use: No     Allergies   Patient has no known allergies.   Review of Systems Review of Systems   Physical Exam Triage Vital Signs ED Triage Vitals [03/24/20 1046]  Enc Vitals Group     BP (!) 150/103     Pulse Rate 76     Resp 20     Temp 98.5 F (36.9 C)     Temp Source Oral     SpO2 98 %     Weight      Height      Head Circumference      Peak Flow      Pain Score 8     Pain Loc      Pain Edu?      Excl. in [place]?    No data found.  Updated Vital Signs BP (!) 150/103 (BP Location: Left Arm)   Pulse 76   Temp 98.5  F (36.9 C) (Oral)   Resp 20   LMP 03/17/2020   SpO2 98%   Breastfeeding No   Visual Acuity Right Eye Distance:   Left Eye Distance:   Bilateral Distance:    Right Eye Near:   Left Eye Near:    Bilateral Near:     Physical Exam Vitals and nursing note reviewed.  Constitutional:      Appearance: Normal appearance. She is normal weight.  Eyes:     Extraocular Movements: Extraocular movements intact.     Pupils: Pupils are equal, round, and reactive to light.     Comments: Injected left conjunctiva diffusely  Pulmonary:     Effort: Pulmonary effort is normal.  Musculoskeletal:        General: Normal range of motion.     Cervical back: Normal range of motion and neck supple.  Skin:    General: Skin is warm and dry.  Neurological:     General: No focal deficit present.     Mental Status: She is alert.  Psychiatric:        Mood and Affect: Mood normal.    Injected left eye with negative lid eversion and flourescein uptake.  UC Treatments / Results  Labs (all labs ordered are listed, but only abnormal results are displayed) Labs Reviewed - No data to display  EKG   Radiology No results found.  Procedures Procedures (including critical care  time)  Medications Ordered in UC Medications - No data to display  Initial Impression / Assessment and Plan / UC Course  I have reviewed the triage vital signs and the nursing notes.  Pertinent labs & imaging results that were available during my care of the patient were reviewed by me and considered in my medical decision making (see chart for details).    Final Clinical Impressions(s) / UC Diagnoses   Final diagnoses:  Acute conjunctivitis of left eye, unspecified acute conjunctivitis type     Discharge Instructions     Use the prescription eye drop twice daily for two days.  No contact use for at least two days    ED Prescriptions    Medication Sig Dispense Auth. Provider   prednisoLONE acetate (PRED FORTE) 1 % ophthalmic suspension Place 1 drop into the left eye in the morning and at bedtime. 5 mL Robyn Haber, MD     I have reviewed the PDMP during this encounter.   Robyn Haber, MD 03/24/20 1115

## 2020-03-24 NOTE — Discharge Instructions (Addendum)
Use the prescription eye drop twice daily for two days.  No contact use for at least two days

## 2021-03-20 ENCOUNTER — Other Ambulatory Visit: Payer: Self-pay

## 2021-03-20 ENCOUNTER — Ambulatory Visit
Admission: EM | Admit: 2021-03-20 | Discharge: 2021-03-20 | Disposition: A | Discharge status: Home / Self Care | Payer: Managed Care, Other (non HMO) | Attending: Physician Assistant | Admitting: Physician Assistant

## 2021-03-20 DIAGNOSIS — Z20828 Contact with and (suspected) exposure to other viral communicable diseases: Secondary | ICD-10-CM

## 2021-03-20 LAB — POCT INFLUENZA A/B
Influenza A, POC: NEGATIVE
Influenza B, POC: NEGATIVE

## 2021-03-20 MED ORDER — OSELTAMIVIR PHOSPHATE 75 MG PO CAPS: 75.0000 mg | ORAL_CAPSULE | Freq: Two times a day (BID) | ORAL | 0 refills | Status: AC

## 2021-03-20 NOTE — ED Provider Notes (Signed)
[facility]    CSN: [id number] Arrival date & time: 03/20/21  2426      History   Chief Complaint Chief Complaint  Patient presents with   Otalgia    HPI Courtney Farmer is a 40 y.o. female.   Patient here today for evaluation of nasal congestion, sore throat and left ear pain that started last night.  Her son recently tested positive for the flu.  She has not had any fever.  She has tried over-the-counter medication including Tylenol with some relief.  The history is provided by the patient.   Past Medical History:  Diagnosis Date   Ectopic pregnancy     Patient Active Problem List   Diagnosis Date Noted   Preterm premature rupture of membranes (PPROM) delivered, current hospitalization 08/09/2014   [redacted] weeks gestation of pregnancy    AMA (advanced maternal age) multigravida 35+    Pregnancy resulting from assisted reproductive technology    Decreased amniotic fluid    [redacted] weeks gestation of pregnancy    Dichorionic diamniotic twin pregnancy in second trimester    Encounter for fetal anatomic survey     Past Surgical History:  Procedure Laterality Date   CESAREAN SECTION N/A 09/03/2014   Procedure: CESAREAN SECTION;  Surgeon: Crawford Givens, MD;  Location: [facility];  Service: Obstetrics;  Laterality: N/A;   NO PAST SURGERIES      OB History     Gravida  3   Para  1   Term      Preterm  1   AB  2   Living  0      SAB  1   IAB      Ectopic  1   Multiple  1   Live Births               Home Medications    Prior to Admission medications   Medication Sig Start Date End Date Taking? Authorizing Provider  oseltamivir (TAMIFLU) 75 MG capsule Take 1 capsule (75 mg total) by mouth every 12 (twelve) hours. 03/20/21  Yes Francene Finders, PA-C    Family History Family History  Problem Relation Age of Onset   Hypertension Mother    Healthy Father     Social History Social History   Tobacco Use   Smoking status: Never    Smokeless tobacco: Never  Substance Use Topics   Alcohol use: Yes   Drug use: No     Allergies   Patient has no known allergies.   Review of Systems Review of Systems  Constitutional:  Negative for chills and fever.  HENT:  Positive for congestion, ear pain, sinus pressure and sore throat.   Eyes:  Negative for discharge and redness.  Respiratory:  Positive for cough. Negative for shortness of breath and wheezing.   Gastrointestinal:  Negative for abdominal pain, diarrhea, nausea and vomiting.    Physical Exam Triage Vital Signs ED Triage Vitals  Enc Vitals Group     BP      Pulse      Resp      Temp      Temp src      SpO2      Weight      Height      Head Circumference      Peak Flow      Pain Score      Pain Loc      Pain Edu?  Excl. in [place]?    No data found.  Updated Vital Signs BP 119/79 (BP Location: Left Arm)   Pulse 83   Temp 98.9 F (37.2 C) (Oral)   Resp 18   LMP 03/06/2021   SpO2 97%      Physical Exam Vitals and nursing note reviewed.  Constitutional:      General: She is not in acute distress.    Appearance: Normal appearance. She is not ill-appearing.  HENT:     Head: Normocephalic and atraumatic.     Right Ear: Tympanic membrane normal.     Left Ear: Tympanic membrane normal.     Nose: Congestion present.     Mouth/Throat:     Mouth: Mucous membranes are moist.     Pharynx: No oropharyngeal exudate or posterior oropharyngeal erythema.  Eyes:     Conjunctiva/sclera: Conjunctivae normal.  Cardiovascular:     Rate and Rhythm: Normal rate and regular rhythm.     Heart sounds: Normal heart sounds. No murmur heard. Pulmonary:     Effort: Pulmonary effort is normal. No respiratory distress.     Breath sounds: Normal breath sounds. No wheezing, rhonchi or rales.  Skin:    General: Skin is warm and dry.  Neurological:     Mental Status: She is alert.  Psychiatric:        Mood and Affect: Mood normal.        Thought Content:  Thought content normal.     UC Treatments / Results  Labs (all labs ordered are listed, but only abnormal results are displayed) Labs Reviewed  POCT INFLUENZA A/B    EKG   Radiology No results found.  Procedures Procedures (including critical care time)  Medications Ordered in UC Medications - No data to display  Initial Impression / Assessment and Plan / UC Course  I have reviewed the triage vital signs and the nursing notes.  Pertinent labs & imaging results that were available during my care of the patient were reviewed by me and considered in my medical decision making (see chart for details).    Flu test negative in office.  Given known exposure however will treat with Tamiflu.  Continue symptomatic treatment otherwise. Encouraged follow-up if symptoms fail to improve or worsen.  Final Clinical Impressions(s) / UC Diagnoses   Final diagnoses:  Exposure to influenza   Discharge Instructions   None    ED Prescriptions     Medication Sig Dispense Auth. Provider   oseltamivir (TAMIFLU) 75 MG capsule Take 1 capsule (75 mg total) by mouth every 12 (twelve) hours. 10 capsule Francene Finders, PA-C      PDMP not reviewed this encounter.   Francene Finders, PA-C 03/20/21 1028

## 2021-03-20 NOTE — ED Triage Notes (Signed)
Pt c/o flu like sx's yesterday, lt ear pain last night. Took tylenol at 6am with relief. States her daughter is flu positive.

## 2022-04-24 ENCOUNTER — Other Ambulatory Visit: Payer: Self-pay | Admitting: Obstetrics and Gynecology

## 2022-04-24 DIAGNOSIS — Z1231 Encounter for screening mammogram for malignant neoplasm of breast: Secondary | ICD-10-CM

## 2022-04-25 ENCOUNTER — Ambulatory Visit
Admission: RE | Admit: 2022-04-25 | Discharge: 2022-04-25 | Disposition: A | Discharge status: Home / Self Care | Payer: Managed Care, Other (non HMO) | Source: Ambulatory Visit

## 2022-04-25 DIAGNOSIS — Z1231 Encounter for screening mammogram for malignant neoplasm of breast: Secondary | ICD-10-CM

## 2022-04-30 ENCOUNTER — Other Ambulatory Visit: Payer: Self-pay | Admitting: Obstetrics and Gynecology

## 2022-04-30 DIAGNOSIS — R928 Other abnormal and inconclusive findings on diagnostic imaging of breast: Secondary | ICD-10-CM

## 2022-05-08 ENCOUNTER — Ambulatory Visit
Admission: RE | Admit: 2022-05-08 | Discharge: 2022-05-08 | Disposition: A | Discharge status: Home / Self Care | Payer: Managed Care, Other (non HMO) | Source: Ambulatory Visit | Attending: Obstetrics and Gynecology | Admitting: Obstetrics and Gynecology

## 2022-05-08 ENCOUNTER — Ambulatory Visit: Payer: Managed Care, Other (non HMO)

## 2022-05-08 DIAGNOSIS — R928 Other abnormal and inconclusive findings on diagnostic imaging of breast: Secondary | ICD-10-CM

## 2022-05-09 ENCOUNTER — Encounter: Payer: Self-pay | Admitting: Obstetrics and Gynecology

## 2023-09-09 ENCOUNTER — Ambulatory Visit (INDEPENDENT_AMBULATORY_CARE_PROVIDER_SITE_OTHER): Payer: Managed Care, Other (non HMO) | Admitting: Family

## 2023-09-09 VITALS — BP 130/90 | HR 75 | Temp 98.4°F | Resp 16 | Ht 66.0 in | Wt 231.0 lb

## 2023-09-09 DIAGNOSIS — Z0130 Encounter for examination of blood pressure without abnormal findings: Secondary | ICD-10-CM | POA: Diagnosis not present

## 2023-09-09 DIAGNOSIS — Z8639 Personal history of other endocrine, nutritional and metabolic disease: Secondary | ICD-10-CM | POA: Diagnosis not present

## 2023-09-09 DIAGNOSIS — Z7689 Persons encountering health services in other specified circumstances: Secondary | ICD-10-CM

## 2023-09-09 NOTE — Progress Notes (Signed)
 B/p concerns, concerns about cholesterol

## 2023-09-09 NOTE — Progress Notes (Signed)
 Subjective:    Courtney Farmer - 43 y.o. female MRN [id number]  Date of birth: 06/30/1980  HPI  Courtney Farmer is to establish care.   Current issues and/or concerns: - Blood pressure check. States in the past elevated blood pressure caused headaches. States she does not have a personal history of high blood pressure. She is trying to limit salt intake. She does not routinely exercise. She does not complain of red flag symptoms such as but not limited to chest pain, shortness of breath, worst headache of life, nausea/vomiting.  - States in the past she was told that she has high cholesterol. States she was not prescribed medication to help with high cholesterol but was told to change diet. She is not fasting today to have cholesterol checked and plans to return at later date for annual exam.  - No further issues/concerns for discussion today.   ROS per HPI     Health Maintenance:  Health Maintenance Due  Topic Date Due   Hepatitis C Screening  Never done   Cervical Cancer Screening (HPV/Pap Cotest)  07/07/2023     Past Medical History: Patient Active Problem List   Diagnosis Date Noted   Preterm premature rupture of membranes (PPROM) delivered, current hospitalization 08/09/2014   [redacted] weeks gestation of pregnancy    AMA (advanced maternal age) multigravida 35+    Pregnancy resulting from assisted reproductive technology    Decreased amniotic fluid    [redacted] weeks gestation of pregnancy    Dichorionic diamniotic twin pregnancy in second trimester    Encounter for fetal anatomic survey       Social History   reports that she has never smoked. She has never used smokeless tobacco. She reports current alcohol use. She reports that she does not use drugs.   Family History  family history includes Healthy in her father; Hypertension in her mother.   Medications: reviewed and updated   Objective:   Physical Exam BP (!) 130/90   Pulse 75   Temp 98.4 F (36.9 C) (Oral)    Resp 16   Ht 5\' 6"  (1.676 m)   Wt 231 lb (104.8 kg)   LMP 08/23/2023 (Approximate)   SpO2 98%   BMI 37.28 kg/m   Physical Exam HENT:     Head: Normocephalic and atraumatic.     Nose: Nose normal.     Mouth/Throat:     Mouth: Mucous membranes are moist.     Pharynx: Oropharynx is clear.  Eyes:     Extraocular Movements: Extraocular movements intact.     Conjunctiva/sclera: Conjunctivae normal.     Pupils: Pupils are equal, round, and reactive to light.  Cardiovascular:     Rate and Rhythm: Normal rate and regular rhythm.     Pulses: Normal pulses.     Heart sounds: Normal heart sounds.  Pulmonary:     Effort: Pulmonary effort is normal.     Breath sounds: Normal breath sounds.  Musculoskeletal:        General: Normal range of motion.     Cervical back: Normal range of motion and neck supple.  Neurological:     General: No focal deficit present.     Mental Status: She is alert and oriented to person, place, and time.  Psychiatric:        Mood and Affect: Mood normal.        Behavior: Behavior normal.        Assessment & Plan:  1. Encounter to establish  care (Primary) - Patient presents today to establish care. During the interim follow-up with primary provider as scheduled.  - Return for annual physical examination, labs, and health maintenance. Arrive fasting meaning having no food for at least 8 hours prior to appointment. You may have only water or black coffee. Please take scheduled medications as normal.  2. Blood pressure check - Blood pressure relative to goal. Patient asymptomatic without chest pressure, chest pain, palpitations, shortness of breath, worst headache of life, and any additional red flag symptoms. - Follow-up with primary provider in 4 weeks or sooner if needed.    3. History of hyperlipidemia - Practice low-fat heart healthy diet and at least 150 minutes of moderate intensity exercise weekly as tolerated.  - Follow-up with primary provider in 4  weeks or sooner if needed.    Patient was given clear instructions to go to Emergency Department or return to medical center if symptoms don't improve, worsen, or new problems develop.The patient verbalized understanding.  I discussed the assessment and treatment plan with the patient. The patient was provided an opportunity to ask questions and all were answered. The patient agreed with the plan and demonstrated an understanding of the instructions.   The patient was advised to call back or seek an in-person evaluation if the symptoms worsen or if the condition fails to improve as anticipated.    Lavona Pounds, NP 09/09/2023, 4:23 PM Primary Care at [facility]

## 2023-10-19 ENCOUNTER — Encounter: Admitting: Family

## 2024-08-23 ENCOUNTER — Encounter: Payer: Self-pay | Admitting: Family
